# Patient Record
Sex: Female | Born: 1980 | Race: Black or African American | Hispanic: No | Marital: Single | Smoking: Never smoker
Health system: Southern US, Community
[De-identification: ages and names within clinical notes are randomized; demographics above are authoritative.]

## PROBLEM LIST (undated history)

## (undated) DIAGNOSIS — N200 Calculus of kidney: Secondary | ICD-10-CM

## (undated) DIAGNOSIS — K859 Acute pancreatitis without necrosis or infection, unspecified: Secondary | ICD-10-CM

## (undated) HISTORY — PX: ECTOPIC PREGNANCY SURGERY: SHX613

---

## 2011-06-13 ENCOUNTER — Emergency Department: Payer: Self-pay | Admitting: *Deleted

## 2011-06-13 LAB — URINALYSIS, COMPLETE
Bilirubin,UR: NEGATIVE
Glucose,UR: NEGATIVE mg/dL (ref 0–75)
Leukocyte Esterase: NEGATIVE
Nitrite: NEGATIVE
Ph: 7 (ref 4.5–8.0)
Protein: 30
RBC,UR: 4 /HPF (ref 0–5)
Specific Gravity: 1.023 (ref 1.003–1.030)

## 2011-06-13 LAB — PREGNANCY, URINE: Pregnancy Test, Urine: NEGATIVE m[IU]/mL

## 2011-06-21 ENCOUNTER — Emergency Department (HOSPITAL_COMMUNITY)
Admission: EM | Admit: 2011-06-21 | Discharge: 2011-06-21 | Disposition: A | Discharge status: Home / Self Care | Payer: Self-pay | Attending: Emergency Medicine | Admitting: Emergency Medicine

## 2011-06-21 ENCOUNTER — Encounter (HOSPITAL_COMMUNITY): Payer: Self-pay

## 2011-06-21 DIAGNOSIS — R300 Dysuria: Secondary | ICD-10-CM | POA: Insufficient documentation

## 2011-06-21 DIAGNOSIS — R000 Tachycardia, unspecified: Secondary | ICD-10-CM | POA: Insufficient documentation

## 2011-06-21 DIAGNOSIS — R630 Anorexia: Secondary | ICD-10-CM | POA: Insufficient documentation

## 2011-06-21 DIAGNOSIS — R112 Nausea with vomiting, unspecified: Secondary | ICD-10-CM | POA: Insufficient documentation

## 2011-06-21 DIAGNOSIS — R319 Hematuria, unspecified: Secondary | ICD-10-CM | POA: Insufficient documentation

## 2011-06-21 DIAGNOSIS — R109 Unspecified abdominal pain: Secondary | ICD-10-CM | POA: Insufficient documentation

## 2011-06-21 DIAGNOSIS — R1031 Right lower quadrant pain: Secondary | ICD-10-CM | POA: Insufficient documentation

## 2011-06-21 LAB — BASIC METABOLIC PANEL
CO2: 16 mEq/L — ABNORMAL LOW (ref 19–32)
Calcium: 9.8 mg/dL (ref 8.4–10.5)
Chloride: 102 mEq/L (ref 96–112)
Glucose, Bld: 122 mg/dL — ABNORMAL HIGH (ref 70–99)
Potassium: 4.1 mEq/L (ref 3.5–5.1)
Sodium: 134 mEq/L — ABNORMAL LOW (ref 135–145)

## 2011-06-21 LAB — DIFFERENTIAL
Basophils Relative: 0 % (ref 0–1)
Eosinophils Relative: 1 % (ref 0–5)
Monocytes Absolute: 0.8 10*3/uL (ref 0.1–1.0)
Monocytes Relative: 8 % (ref 3–12)
Neutrophils Relative %: 72 % (ref 43–77)

## 2011-06-21 LAB — CBC
Hemoglobin: 11.5 g/dL — ABNORMAL LOW (ref 12.0–15.0)
MCH: 21.7 pg — ABNORMAL LOW (ref 26.0–34.0)
RBC: 5.31 MIL/uL — ABNORMAL HIGH (ref 3.87–5.11)
WBC: 9.4 10*3/uL (ref 4.0–10.5)

## 2011-06-21 MED ORDER — PROMETHAZINE HCL 25 MG/ML IJ SOLN
25.0000 mg | Freq: Once | INTRAMUSCULAR | Status: AC
  Administered 2011-06-21: 25 mg via INTRAVENOUS
  Filled 2011-06-21: qty 1

## 2011-06-21 MED ORDER — SODIUM CHLORIDE 0.9 % IV BOLUS (SEPSIS)
1000.0000 mL | Freq: Once | INTRAVENOUS | Status: AC
  Administered 2011-06-21: 1000 mL via INTRAVENOUS

## 2011-06-21 MED ORDER — HYDROMORPHONE HCL PF 1 MG/ML IJ SOLN
1.0000 mg | Freq: Once | INTRAMUSCULAR | Status: AC
  Administered 2011-06-21: 1 mg via INTRAVENOUS
  Filled 2011-06-21: qty 1

## 2011-06-21 MED ORDER — MORPHINE SULFATE 4 MG/ML IJ SOLN
4.0000 mg | Freq: Once | INTRAMUSCULAR | Status: AC
  Administered 2011-06-21: 4 mg via INTRAVENOUS
  Filled 2011-06-21: qty 1

## 2011-06-21 MED ORDER — SODIUM CHLORIDE 0.9 % IV SOLN: INTRAVENOUS | Status: DC

## 2011-06-21 NOTE — ED Notes (Signed)
[employer] of right sided abdominal pain radiating to lower back. States that there was blood in the urine last void and blood in emesis. States history of kidney stones.

## 2011-06-21 NOTE — ED Notes (Signed)
Rt sided abd and flank pain x 2 days has blood in [place] she states

## 2011-06-21 NOTE — ED Notes (Signed)
Reminded patient we need a urine sample.  Patient tried to urinate but was unable to.

## 2011-06-21 NOTE — ED Notes (Signed)
Pt. Is aware of need a urine specimen

## 2011-06-21 NOTE — ED Notes (Signed)
WEn into reevaluate pt.s  Pain level and she was not in the room..  Unable to locate pt.  She is not in the waiting area.

## 2011-06-21 NOTE — ED Provider Notes (Signed)
History     CSN: [id number]  Arrival date & time 06/21/11  1102   First MD Initiated Contact with Patient 06/21/11 1148      Chief Complaint  Patient presents with  . Abdominal Pain    (Consider location/radiation/quality/duration/timing/severity/associated sxs/prior treatment) Patient is a 31 y.o. female presenting with abdominal pain. The history is provided by the patient. The history is limited by the condition of the patient.  Abdominal Pain The primary symptoms of the illness include abdominal pain, nausea, vomiting and dysuria. The primary symptoms of the illness do not include fever, diarrhea, vaginal discharge or vaginal bleeding.  The dysuria is associated with hematuria.  Additional symptoms associated with the illness include hematuria. Symptoms associated with the illness do not include chills.   the patient is a 31 year old, female, with no significant past medical history, who complains of right flank pain, and right lower abdominal pain for the past 2 days.  She has also had nausea, vomiting, and hematuria.  She denies dysuria.  She has not having her menstrual cycle.  She takes Depo for pregnancy prophylaxis.  She has not had prior abdominal surgery.  Level V caveat applies for urgent need for intervention.  Because of severe pain  No past medical history on file.  No past surgical history on file.  No family history on file.  History  Substance Use Topics  . Smoking status: Not on file  . Smokeless tobacco: Not on file  . Alcohol Use: Not on file    OB History    No data available      Review of Systems  Constitutional: Positive for appetite change. Negative for fever and chills.       Anorexia because of abdominal pain, and nausea and vomiting  Respiratory: Negative for cough.   Cardiovascular: Negative for chest pain.  Gastrointestinal: Positive for nausea, vomiting and abdominal pain. Negative for diarrhea.  Genitourinary: Positive for dysuria,  hematuria and flank pain. Negative for vaginal bleeding and vaginal discharge.  Skin: Negative for rash.  Neurological: Negative for headaches.  Psychiatric/Behavioral: Negative for confusion.  All other systems reviewed and are negative.    Allergies  Reglan and Zofran  Home Medications  No current outpatient prescriptions on file.  BP 124/99  Pulse 115  Temp 98.7 F (37.1 C)  Resp 20  SpO2 99%  Physical Exam  Vitals reviewed. Constitutional: She is oriented to person, place, and time. She appears well-developed and well-nourished. She appears distressed.       Moaning in pain  HENT:  Head: Normocephalic and atraumatic.  Eyes: Pupils are equal, round, and reactive to light.  Neck: Normal range of motion. Neck supple.  Cardiovascular:  No murmur heard.      Tachycardia  Pulmonary/Chest: Effort normal. No respiratory distress. She has no wheezes. She has no rales.  Abdominal: Soft. There is tenderness. There is guarding. There is no rebound.       Right lower abdominal tenderness, with guarding.  No rebound  Genitourinary:       Right or back tenderness  Musculoskeletal: Normal range of motion. She exhibits no edema.  Neurological: She is alert and oriented to person, place, and time. No cranial nerve deficit.  Skin: Skin is warm and dry.  Psychiatric: She has a normal mood and affect. Thought content normal.    ED Course  Procedures (including critical care time) 31 year old, female, with no significant past medical history presents with lower abdominal pain, dysuria and hematuria  and nausea and vomiting.  For the past 2 days.  She is tachycardic, in moderate distress and has a tender.  Abdomen.  We will establish an IV perform laboratory testing, and a urinalysis, and treat her symptoms.  I will get a CAT scan with and without contrast.  Depending on results of the urine tests.   Labs Reviewed  URINALYSIS, ROUTINE W REFLEX MICROSCOPIC  CBC  DIFFERENTIAL  BASIC  METABOLIC PANEL  PREGNANCY, URINE   No results found.   No diagnosis found.    MDM  Abdominal pain, with nausea and vomiting, and tachycardia        Cheri Guppy, MD 06/27/11 [phone]

## 2011-08-09 ENCOUNTER — Emergency Department (HOSPITAL_COMMUNITY): Payer: Self-pay

## 2011-08-09 ENCOUNTER — Emergency Department (HOSPITAL_COMMUNITY)
Admission: EM | Admit: 2011-08-09 | Discharge: 2011-08-09 | Disposition: A | Discharge status: Home / Self Care | Payer: Self-pay | Attending: Emergency Medicine | Admitting: Emergency Medicine

## 2011-08-09 ENCOUNTER — Encounter (HOSPITAL_COMMUNITY): Payer: Self-pay | Admitting: Emergency Medicine

## 2011-08-09 DIAGNOSIS — R109 Unspecified abdominal pain: Secondary | ICD-10-CM | POA: Insufficient documentation

## 2011-08-09 DIAGNOSIS — R112 Nausea with vomiting, unspecified: Secondary | ICD-10-CM | POA: Insufficient documentation

## 2011-08-09 DIAGNOSIS — R319 Hematuria, unspecified: Secondary | ICD-10-CM | POA: Insufficient documentation

## 2011-08-09 DIAGNOSIS — Z87442 Personal history of urinary calculi: Secondary | ICD-10-CM | POA: Insufficient documentation

## 2011-08-09 DIAGNOSIS — R300 Dysuria: Secondary | ICD-10-CM | POA: Insufficient documentation

## 2011-08-09 HISTORY — DX: Calculus of kidney: N20.0

## 2011-08-09 LAB — URINALYSIS, ROUTINE W REFLEX MICROSCOPIC
Glucose, UA: NEGATIVE mg/dL
Hgb urine dipstick: NEGATIVE
Leukocytes, UA: NEGATIVE
Specific Gravity, Urine: 1.009 (ref 1.005–1.030)
pH: 6.5 (ref 5.0–8.0)

## 2011-08-09 LAB — BASIC METABOLIC PANEL
BUN: 9 mg/dL (ref 6–23)
CO2: 21 mEq/L (ref 19–32)
Chloride: 99 mEq/L (ref 96–112)
Creatinine, Ser: 0.8 mg/dL (ref 0.50–1.10)
GFR calc Af Amer: >90 mL/min (ref >90)
Potassium: 3.6 mEq/L (ref 3.5–5.1)

## 2011-08-09 LAB — DIFFERENTIAL
Basophils Absolute: 0 10*3/uL (ref 0.0–0.1)
Eosinophils Relative: 1 % (ref 0–5)
Lymphocytes Relative: 25 % (ref 12–46)
Lymphs Abs: 3.3 10*3/uL (ref 0.7–4.0)
Monocytes Relative: 7 % (ref 3–12)
Neutro Abs: 9 10*3/uL — ABNORMAL HIGH (ref 1.7–7.7)

## 2011-08-09 LAB — CBC
HCT: 34.5 % — ABNORMAL LOW (ref 36.0–46.0)
MCV: 64.4 fL — ABNORMAL LOW (ref 78.0–100.0)
RBC: 5.36 MIL/uL — ABNORMAL HIGH (ref 3.87–5.11)
RDW: 15.2 % (ref 11.5–15.5)
WBC: 13.3 10*3/uL — ABNORMAL HIGH (ref 4.0–10.5)

## 2011-08-09 LAB — POCT PREGNANCY, URINE: Preg Test, Ur: NEGATIVE

## 2011-08-09 MED ORDER — OXYCODONE-ACETAMINOPHEN 5-325 MG PO TABS
2.0000 | ORAL_TABLET | Freq: Once | ORAL | Status: DC
  Filled 2011-08-09: qty 2

## 2011-08-09 MED ORDER — HYDROMORPHONE HCL PF 1 MG/ML IJ SOLN
1.0000 mg | Freq: Once | INTRAMUSCULAR | Status: AC
  Administered 2011-08-09: 1 mg via INTRAVENOUS
  Filled 2011-08-09: qty 1

## 2011-08-09 MED ORDER — NAPROXEN 500 MG PO TABS: 500.0000 mg | ORAL_TABLET | Freq: Two times a day (BID) | ORAL | Status: DC

## 2011-08-09 MED ORDER — SODIUM CHLORIDE 0.9 % IV BOLUS (SEPSIS)
1000.0000 mL | Freq: Once | INTRAVENOUS | Status: AC
  Administered 2011-08-09: 1000 mL via INTRAVENOUS

## 2011-08-09 MED ORDER — PROMETHAZINE HCL 25 MG PO TABS
25.0000 mg | ORAL_TABLET | Freq: Four times a day (QID) | ORAL | Status: DC | PRN
  Administered 2011-08-09: 25 mg via ORAL
  Filled 2011-08-09: qty 1

## 2011-08-09 MED ORDER — FENTANYL CITRATE 0.05 MG/ML IJ SOLN
100.0000 ug | Freq: Once | INTRAMUSCULAR | Status: AC
  Administered 2011-08-09: 100 ug via INTRAVENOUS
  Filled 2011-08-09: qty 2

## 2011-08-09 MED ORDER — KETOROLAC TROMETHAMINE 30 MG/ML IJ SOLN
30.0000 mg | Freq: Once | INTRAMUSCULAR | Status: AC
  Administered 2011-08-09: 30 mg via INTRAVENOUS
  Filled 2011-08-09: qty 1

## 2011-08-09 MED ORDER — PROMETHAZINE HCL 25 MG/ML IJ SOLN
12.5000 mg | Freq: Once | INTRAMUSCULAR | Status: AC
  Administered 2011-08-09: 12.5 mg via INTRAVENOUS
  Filled 2011-08-09: qty 1

## 2011-08-09 NOTE — Discharge Instructions (Signed)
You were seen and evaluated today for your complaints of right flank pains. Your lab testing, urine tests and CT scan of your abdomen does have not shown any signs for concerning or emergent cause your symptoms. There were no signs for kidney stones. No signs for appendicitis. There is no other signs for emergent conditions. Please followup with your primary care provider for further evaluation and treatment of your symptoms.   Flank Pain Flank pain refers to pain that is located on the side of the body between the upper abdomen and the back. It can be caused by many things. CAUSES  Some of the more common causes of flank pain include:  Muscle strain.   Muscle spasms.   A disease of your spine (vertebral disk disease).   A lung infection (pneumonia).   Fluid around your lungs (pulmonary edema).   A kidney infection.   Kidney stones.   A very painful skin rash on only one side of your body (shingles).   Gallbladder disease.  DIAGNOSIS  Blood tests, urine tests, and X-rays may help your caregiver determine what is wrong. TREATMENT  The treatment of pain depends on the cause. Your caregiver will determine what treatment will work best for you. HOME CARE INSTRUCTIONS   Home care will depend on the cause of your pain.   Some medications may help relieve the pain. Take medication for relief of pain as directed by your caregiver.   Tell your caregiver about any changes in your pain.   Follow up with your caregiver.  SEEK IMMEDIATE MEDICAL CARE IF:   Your pain is not controlled with medication.   The pain increases.   You have abdominal pain.   You have shortness of breath.   You have persistent nausea or vomiting.   You have swelling in your abdomen.   You feel faint or pass out.   You have a temperature by mouth above 102 F (38.9 C), not controlled by medicine.  MAKE SURE YOU:   Understand these instructions.   Will watch your condition.   Will get help right  away if you are not doing well or get worse.  Document Released: 05/12/2005 Document Revised: 03/10/2011 Document Reviewed: 09/05/2009 [facility] Patient Information 2012 [place], [place].   Back Pain, Adult Low back pain is very common. About 1 in 5 people have back pain.The cause of low back pain is rarely dangerous. The pain often gets better over time.About half of people with a sudden onset of back pain feel better in just 2 weeks. About 8 in 10 people feel better by 6 weeks.  CAUSES Some common causes of back pain include:  Strain of the muscles or ligaments supporting the spine.   Wear and tear (degeneration) of the spinal discs.   Arthritis.   Direct injury to the back.  DIAGNOSIS Most of the time, the direct cause of low back pain is not known.However, back pain can be treated effectively even when the exact cause of the pain is unknown.Answering your caregiver's questions about your overall health and symptoms is one of the most accurate ways to make sure the cause of your pain is not dangerous. If your caregiver needs more information, he or she may order lab work or imaging tests (X-rays or MRIs).However, even if imaging tests show changes in your back, this usually does not require surgery. HOME CARE INSTRUCTIONS For many people, back pain returns.Since low back pain is rarely dangerous, it is often a condition that  people can learn to [facility] their own.   Remain active. It is stressful on the back to sit or stand in one place. Do not sit, drive, or stand in one place for more than 30 minutes at a time. Take short walks on level surfaces as soon as pain allows.Try to increase the length of time you walk each day.   Do not stay in bed.Resting more than 1 or 2 days can delay your recovery.   Do not avoid exercise or work.Your body is made to move.It is not dangerous to be active, even though your back may hurt.Your back will likely heal faster if you return to being  active before your pain is gone.   Pay attention to your body when you bend and lift. Many people have less discomfortwhen lifting if they bend their knees, keep the load close to their bodies,and avoid twisting. Often, the most comfortable positions are those that put less stress on your recovering back.   Find a comfortable position to sleep. Use a firm mattress and lie on your side with your knees slightly bent. If you lie on your back, put a pillow under your knees.   Only take over-the-counter or prescription medicines as directed by your caregiver. Over-the-counter medicines to reduce pain and inflammation are often the most helpful.Your caregiver may prescribe muscle relaxant drugs.These medicines help dull your pain so you can more quickly return to your normal activities and healthy exercise.   Put ice on the injured area.   Put ice in a plastic bag.   Place a towel between your skin and the bag.   Leave the ice on for 15 to 20 minutes, 3 to 4 times a day for the first 2 to 3 days. After that, ice and heat may be alternated to reduce pain and spasms.   Ask your caregiver about trying back exercises and gentle massage. This may be of some benefit.   Avoid feeling anxious or stressed.Stress increases muscle tension and can worsen back pain.It is important to recognize when you are anxious or stressed and learn ways to manage it.Exercise is a great option.  SEEK MEDICAL CARE IF:  You have pain that is not relieved with rest or medicine.   You have pain that does not improve in 1 week.   You have new symptoms.   You are generally not feeling well.  SEEK IMMEDIATE MEDICAL CARE IF:   You have pain that radiates from your back into your legs.   You develop new bowel or bladder control problems.   You have unusual weakness or numbness in your arms or legs.   You develop nausea or vomiting.   You develop abdominal pain.   You feel faint.  Document Released: 03/21/2005  Document Revised: 03/10/2011 Document Reviewed: 08/09/2010 [facility] Patient Information 2012 [place], [place].   RESOURCE GUIDE  Dental Problems  Patients with Medicaid: [facility] [phone] [address]                                           [phone] [address] Phone:  [phone]  Phone:  [phone]  If unable to pay or uninsured, contact:  [facility] or [facility]. to become qualified for the adult dental clinic.  Chronic Pain Problems Contact [facility]  [phone] Patients need to be referred by their primary care doctor.  Insufficient Money for Medicine Contact United Way:  call "211" or [facility] [phone])635-1577.  No Primary Care Doctor Call [facility]  640-367-2667 Other agencies that provide inexpensive medical care    [facility]  [phone]    [facility]  [phone]    [facility]  [phone]    [facility]  [phone]    [facility]  [phone]    [facility]  [phone]  Psychological Services [facility]  [phone] [facility] Services  [phone] [facility]   [phone] (emergency services [phone])  Substance Abuse Resources [facility]  [phone] [facility] 2232524055 [facility] [place] [phone] Floydene Flock ([phone] Residential & Outpatient Substance Abuse Program  [phone]  Abuse/Neglect [facility] Child Abuse Hotline 509-155-9117 [facility] Child Abuse Hotline [phone] (After Hours)  Emergency Shelter [facility] 323-141-1375  Maternity Homes [facility] at the [place] [facility] [place] 579-505-8688 [facility] [phone]  MRSA Hotline #:   203-636-7135    [facility] Resources  [facility]     United Way                           [facility] Dept. [address] [place]                       [address]      [address]  Blondell Reveal Phone:  [phone]                                   Phone:  [phone]                 Phone:  [phone]  [facility] Phone:  [phone]  [facility] Child Abuse Hotline 818 497 2715 [phone] (After Hours)

## 2011-08-09 NOTE — ED Notes (Signed)
Pt is c/o right flank pain that started 2 days ago and has progressively gotten worse  Pt has hx of kidney stones in the past and states pain feels the same  Pt states has nausea without vomiting

## 2011-08-09 NOTE — ED Provider Notes (Signed)
History     CSN: [id number]  Arrival date & time 08/09/11  0015   First MD Initiated Contact with Patient 08/09/11 0106      Chief Complaint  Patient presents with  . Flank Pain    HPI  History provided by the patient. Patient is 31 year old female with reported history of prior kidney stones who presents with complaints of increasing right flank pains for the past 2 days. She reports slight waxing waning and pains that have now become constant and sharp. It radiates some to right lower abdomen area. Pain feels similar to previous kidney stone symptoms. Patient also reports increased pain with urination. She has also had associated nausea and vomiting symptoms. Patient has been taking over-the-counter pain medications without significant relief. She denies any other aggravating or alleviating factors. Patient denies any fever, chills, sweats. She denies any vaginal bleeding or vaginal discharge.     Past Medical History  Diagnosis Date  . Kidney stones     History reviewed. No pertinent past surgical history.  History reviewed. No pertinent family history.  History  Substance Use Topics  . Smoking status: Never Smoker   . Smokeless tobacco: Not on file  . Alcohol Use: No    OB History    Grav Para Term Preterm Abortions TAB SAB Ect Mult Living                  Review of Systems  Constitutional: Negative for fever and chills.  Respiratory: Negative for shortness of breath.   Cardiovascular: Negative for chest pain.  Gastrointestinal: Positive for nausea and vomiting. Negative for diarrhea and constipation.  Genitourinary: Positive for dysuria, hematuria and flank pain. Negative for frequency.    Allergies  Metoclopramide hcl and Zofran  Home Medications   Current Outpatient Rx  Name Route Sig Dispense Refill  . IBUPROFEN 200 MG PO TABS Oral Take 200 mg by mouth every 6 (six) hours as needed. Pain    . MEDROXYPROGESTERONE ACETATE 150 MG/ML IM SUSP Intramuscular  Inject 150 mg into the muscle every 3 (three) months.      BP 121/79  Pulse 85  Temp(Src) 98.3 F (36.8 C) (Oral)  Resp 18  Physical Exam  Nursing note and vitals reviewed. Constitutional: She is oriented to person, place, and time. She appears well-developed and well-nourished. No distress.  HENT:  Head: Normocephalic.  Cardiovascular: Normal rate and regular rhythm.   Pulmonary/Chest: Effort normal and breath sounds normal. No respiratory distress. She has no wheezes.  Abdominal: Soft. She exhibits no distension. There is no tenderness. There is no rebound and no guarding.       Right CVA tenderness  Neurological: She is alert and oriented to person, place, and time.  Skin: Skin is warm and dry. No rash noted.  Psychiatric: She has a normal mood and affect. Her behavior is normal.    ED Course  Procedures    Results for orders placed during the hospital encounter of 08/09/11  URINALYSIS, ROUTINE W REFLEX MICROSCOPIC      Component Value Range   Color, Urine YELLOW  YELLOW    APPearance CLOUDY (*) CLEAR    Specific Gravity, Urine 1.009  1.005 - 1.030    pH 6.5  5.0 - 8.0    Glucose, UA NEGATIVE  NEGATIVE (mg/dL)   Hgb urine dipstick NEGATIVE  NEGATIVE    Bilirubin Urine NEGATIVE  NEGATIVE    Ketones, ur NEGATIVE  NEGATIVE (mg/dL)   Protein, ur NEGATIVE  NEGATIVE (mg/dL)   Urobilinogen, UA 0.2  0.0 - 1.0 (mg/dL)   Nitrite NEGATIVE  NEGATIVE    Leukocytes, UA NEGATIVE  NEGATIVE   CBC      Component Value Range   WBC 13.3 (*) 4.0 - 10.5 (K/uL)   RBC 5.36 (*) 3.87 - 5.11 (MIL/uL)   Hemoglobin 11.4 (*) 12.0 - 15.0 (g/dL)   HCT 24.4 (*) 01.0 - 46.0 (%)   MCV 64.4 (*) 78.0 - 100.0 (fL)   MCH 21.3 (*) 26.0 - 34.0 (pg)   MCHC 33.0  30.0 - 36.0 (g/dL)   RDW 27.2  53.6 - 64.4 (%)   Platelets 266  150 - 400 (K/uL)  DIFFERENTIAL      Component Value Range   Neutrophils Relative 67  43 - 77 (%)   Lymphocytes Relative 25  12 - 46 (%)   Monocytes Relative 7  3 - 12 (%)    Eosinophils Relative 1  0 - 5 (%)   Basophils Relative 0  0 - 1 (%)   Neutro Abs 9.0 (*) 1.7 - 7.7 (K/uL)   Lymphs Abs 3.3  0.7 - 4.0 (K/uL)   Monocytes Absolute 0.9  0.1 - 1.0 (K/uL)   Eosinophils Absolute 0.1  0.0 - 0.7 (K/uL)   Basophils Absolute 0.0  0.0 - 0.1 (K/uL)   RBC Morphology TARGET CELLS    BASIC METABOLIC PANEL      Component Value Range   Sodium 137  135 - 145 (mEq/L)   Potassium 3.6  3.5 - 5.1 (mEq/L)   Chloride 99  96 - 112 (mEq/L)   CO2 21  19 - 32 (mEq/L)   Glucose, Bld 113 (*) 70 - 99 (mg/dL)   BUN 9  6 - 23 (mg/dL)   Creatinine, Ser 0.34  0.50 - 1.10 (mg/dL)   Calcium 9.8  8.4 - 74.2 (mg/dL)   GFR calc non Af Amer >90  >90 (mL/min)   GFR calc Af Amer >90  >90 (mL/min)  POCT PREGNANCY, URINE      Component Value Range   Preg Test, Ur NEGATIVE  NEGATIVE      Ct Abdomen Pelvis Wo Contrast  08/09/2011  *RADIOLOGY REPORT*  Clinical Data: Right flank pain and nausea.  Leukocytosis.  CT ABDOMEN AND PELVIS WITHOUT CONTRAST  Technique:  Multidetector CT imaging of the abdomen and pelvis was performed following the standard protocol without intravenous contrast.  Comparison: Abdominal radiograph performed 07/07/2011  Findings: The minimally visualized lung bases are clear.  The visualized portions of the liver and spleen are unremarkable in appearance.  The gallbladder is within normal limits.  The pancreas and adrenal glands are unremarkable.  The kidneys are unremarkable in appearance.  There is no evidence of hydronephrosis.  No renal or ureteral stones are seen.  No perinephric stranding is appreciated.  No free fluid is identified.  The small bowel is unremarkable in appearance.  The stomach is within normal limits.  No acute vascular abnormalities are seen.  The appendix is normal in caliber and contains air, without evidence for appendicitis.  Apparent haziness about the ascending and transverse colon is thought to reflect motion artifact.  The colon is grossly  unremarkable in appearance.  The bladder is decompressed and not well assessed.  Mild apparent soft tissue stranding about the bladder could reflect mild cystitis.  The uterus is grossly unremarkable in appearance, though not well assessed without contrast.  The ovaries are grossly symmetric; no suspicious adnexal masses are  seen.  No inguinal lymphadenopathy is seen.  No acute osseous abnormalities are identified.  IMPRESSION:  1.  Mild apparent soft tissue stranding about the bladder may reflect mild cystitis, though this is not well characterized due to bladder decompression. 2.  Otherwise unremarkable CT of the abdomen and pelvis.  Original Report Authenticated By: Tonia Ghent, M.D.     1. Flank pain       MDM  1:10AM patient seen and evaluated. Patient no acute distress but does appear uncomfortable.  Patient continues to complain of pain despite pain medications given. Patient requesting a dose of Dilaudid and Phenergan given simultaneously. Labs this point are unremarkable. Will give additional dose of.it.  CT scan is unremarkable. No signs for kidney stone or hydronephrosis. No signs for appendicitis or other surgical emergency. Patient continues to complain of pain and requesting additional pain medications. Patient is originally from [place] area but states she is here visiting her boyfriend. Patient has had 2 visits to the emergency room recently for abdominal pain complaints. She has had normal workups each time. Drug database shows the patient has received narcotic pain prescriptions multiple times from different providers and locations of the past several months. Patient denied to me having any other previous medical conditions or being on any medications.  At this time patient does not appear to have any emergent condition and will discharge home.      Angus Seller, [place] 08/10/11 [phone]

## 2011-08-10 NOTE — ED Provider Notes (Signed)
Medical screening examination/treatment/procedure(s) were performed by non-physician practitioner and as supervising physician I was immediately available for consultation/collaboration.   Hanley Seamen, MD 08/10/11 [phone]

## 2011-12-11 ENCOUNTER — Emergency Department: Payer: Self-pay | Admitting: Emergency Medicine

## 2011-12-11 LAB — URINALYSIS, COMPLETE
Glucose,UR: NEGATIVE mg/dL (ref 0–75)
Nitrite: NEGATIVE
Ph: 7 (ref 4.5–8.0)
Protein: 30
Specific Gravity: 1.021 (ref 1.003–1.030)

## 2011-12-11 LAB — CBC WITH DIFFERENTIAL/PLATELET
Basophil #: 0.1 10*3/uL (ref 0.0–0.1)
Basophil %: 0.7 %
Eosinophil #: 0 10*3/uL (ref 0.0–0.7)
Eosinophil %: 0.4 %
HCT: 35.5 % (ref 35.0–47.0)
HGB: 11.3 g/dL — ABNORMAL LOW (ref 12.0–16.0)
Lymphocyte #: 1.8 10*3/uL (ref 1.0–3.6)
Lymphocyte %: 17.1 %
MCHC: 31.8 g/dL — ABNORMAL LOW (ref 32.0–36.0)
MCV: 68 fL — ABNORMAL LOW (ref 80–100)
Monocyte %: 5.4 %
Neutrophil #: 8 10*3/uL — ABNORMAL HIGH (ref 1.4–6.5)
Neutrophil %: 76.4 %
RDW: 15.7 % — ABNORMAL HIGH (ref 11.5–14.5)
WBC: 10.5 10*3/uL (ref 3.6–11.0)

## 2011-12-11 LAB — COMPREHENSIVE METABOLIC PANEL
Anion Gap: 8 (ref 7–16)
BUN: 8 mg/dL (ref 7–18)
Bilirubin,Total: 0.3 mg/dL (ref 0.2–1.0)
Chloride: 108 mmol/L — ABNORMAL HIGH (ref 98–107)
Co2: 23 mmol/L (ref 21–32)
Creatinine: 0.75 mg/dL (ref 0.60–1.30)
EGFR (Non-African Amer.): >60
Glucose: 115 mg/dL — ABNORMAL HIGH (ref 65–99)
Potassium: 4.1 mmol/L (ref 3.5–5.1)
SGOT(AST): 29 U/L (ref 15–37)
SGPT (ALT): 14 U/L (ref 12–78)
Sodium: 139 mmol/L (ref 136–145)

## 2011-12-13 LAB — URINE CULTURE

## 2012-02-03 ENCOUNTER — Encounter (HOSPITAL_COMMUNITY): Payer: Self-pay | Admitting: *Deleted

## 2012-02-03 ENCOUNTER — Emergency Department (HOSPITAL_COMMUNITY)
Admission: EM | Admit: 2012-02-03 | Discharge: 2012-02-04 | Disposition: A | Discharge status: Home / Self Care | Payer: Self-pay | Attending: Emergency Medicine | Admitting: Emergency Medicine

## 2012-02-03 DIAGNOSIS — R1031 Right lower quadrant pain: Secondary | ICD-10-CM | POA: Insufficient documentation

## 2012-02-03 DIAGNOSIS — Z79899 Other long term (current) drug therapy: Secondary | ICD-10-CM | POA: Insufficient documentation

## 2012-02-03 DIAGNOSIS — Z87442 Personal history of urinary calculi: Secondary | ICD-10-CM | POA: Insufficient documentation

## 2012-02-03 DIAGNOSIS — R109 Unspecified abdominal pain: Secondary | ICD-10-CM

## 2012-02-03 DIAGNOSIS — R112 Nausea with vomiting, unspecified: Secondary | ICD-10-CM | POA: Insufficient documentation

## 2012-02-03 NOTE — ED Notes (Signed)
The pt is c/o rt sided abd  And flank pain for 3-4 days. lmp none

## 2012-02-04 ENCOUNTER — Emergency Department (HOSPITAL_COMMUNITY): Payer: Self-pay

## 2012-02-04 LAB — COMPREHENSIVE METABOLIC PANEL
AST: 17 U/L (ref 0–37)
Albumin: 4.3 g/dL (ref 3.5–5.2)
Alkaline Phosphatase: 66 U/L (ref 39–117)
BUN: 12 mg/dL (ref 6–23)
Chloride: 106 mEq/L (ref 96–112)
Creatinine, Ser: 0.88 mg/dL (ref 0.50–1.10)
Potassium: 3.6 mEq/L (ref 3.5–5.1)
Total Protein: 7.8 g/dL (ref 6.0–8.3)

## 2012-02-04 LAB — URINE MICROSCOPIC-ADD ON

## 2012-02-04 LAB — CBC WITH DIFFERENTIAL/PLATELET
Basophils Absolute: 0 10*3/uL (ref 0.0–0.1)
Eosinophils Absolute: 0.1 10*3/uL (ref 0.0–0.7)
HCT: 32.9 % — ABNORMAL LOW (ref 36.0–46.0)
Lymphocytes Relative: 30 % (ref 12–46)
MCHC: 32.5 g/dL (ref 30.0–36.0)
Monocytes Relative: 10 % (ref 3–12)
Neutro Abs: 4.3 10*3/uL (ref 1.7–7.7)
Platelets: 213 10*3/uL (ref 150–400)
RDW: 15.7 % — ABNORMAL HIGH (ref 11.5–15.5)
WBC: 7.3 10*3/uL (ref 4.0–10.5)

## 2012-02-04 LAB — URINALYSIS, ROUTINE W REFLEX MICROSCOPIC
Bilirubin Urine: NEGATIVE
Nitrite: NEGATIVE
Specific Gravity, Urine: 1.039 — ABNORMAL HIGH (ref 1.005–1.030)
Urobilinogen, UA: 1 mg/dL (ref 0.0–1.0)
pH: 7 (ref 5.0–8.0)

## 2012-02-04 LAB — PREGNANCY, URINE: Preg Test, Ur: NEGATIVE

## 2012-02-04 LAB — LIPASE, BLOOD: Lipase: 18 U/L (ref 11–59)

## 2012-02-04 MED ORDER — HYDROMORPHONE HCL PF 1 MG/ML IJ SOLN
1.0000 mg | Freq: Once | INTRAMUSCULAR | Status: AC
  Administered 2012-02-04: 1 mg via INTRAVENOUS
  Filled 2012-02-04: qty 1

## 2012-02-04 MED ORDER — DIPHENHYDRAMINE HCL 50 MG/ML IJ SOLN
25.0000 mg | Freq: Once | INTRAMUSCULAR | Status: AC
  Administered 2012-02-04: 25 mg via INTRAVENOUS
  Filled 2012-02-04: qty 1

## 2012-02-04 MED ORDER — HYDROCODONE-ACETAMINOPHEN 5-500 MG PO TABS: 1.0000 | ORAL_TABLET | Freq: Four times a day (QID) | ORAL | Status: DC | PRN

## 2012-02-04 MED ORDER — PROMETHAZINE HCL 25 MG PO TABS: 25.0000 mg | ORAL_TABLET | Freq: Four times a day (QID) | ORAL | Status: DC | PRN

## 2012-02-04 MED ORDER — PROMETHAZINE HCL 25 MG/ML IJ SOLN
25.0000 mg | Freq: Once | INTRAMUSCULAR | Status: AC
  Administered 2012-02-04: 25 mg via INTRAVENOUS
  Filled 2012-02-04: qty 1

## 2012-02-04 MED ORDER — SODIUM CHLORIDE 0.9 % IV BOLUS (SEPSIS)
2000.0000 mL | Freq: Once | INTRAVENOUS | Status: AC
  Administered 2012-02-04: 2000 mL via INTRAVENOUS

## 2012-02-04 MED ORDER — PROMETHAZINE HCL 25 MG/ML IJ SOLN
12.5000 mg | Freq: Once | INTRAMUSCULAR | Status: AC
  Administered 2012-02-04: 12.5 mg via INTRAVENOUS
  Filled 2012-02-04: qty 1

## 2012-02-04 NOTE — ED Provider Notes (Addendum)
History     CSN: [id number]  Arrival date & time 02/03/12  2248   First MD Initiated Contact with Patient 02/04/12 0003      Chief Complaint  Patient presents with  . Flank Pain    (Consider location/radiation/quality/duration/timing/severity/associated sxs/prior treatment) HPI 31 year old female with a history of kidney stones. She is here with a three-day history of right flank pain radiating to her right lower quadrant and right groin. It acutely worsened today and is now severe. She's been vomiting all day and continues to be nauseated. She denies fever or chills. She states the pain is crampy and feels like previous kidney stones. She states she has not been able to find a comfortable position.   Past Medical History  Diagnosis Date  . Kidney stones     History reviewed. No pertinent past surgical history.  No family history on file.  History  Substance Use Topics  . Smoking status: Never Smoker   . Smokeless tobacco: Not on file  . Alcohol Use: No    OB History    Grav Para Term Preterm Abortions TAB SAB Ect Mult Living                  Review of Systems  All other systems reviewed and are negative.    Allergies  Metoclopramide hcl and Zofran  Home Medications   Current Outpatient Rx  Name Route Sig Dispense Refill  . MEDROXYPROGESTERONE ACETATE 150 MG/ML IM SUSP Intramuscular Inject 150 mg into the muscle every 3 (three) months.      BP 115/77  Pulse 130  Temp 99.2 F (37.3 C) (Oral)  Resp 18  SpO2 98%  Physical Exam General: Well-developed, well-nourished female in no acute distress; appearance consistent with age of record; appears uncomfortable HENT: normocephalic, atraumatic Eyes: pupils equal round and reactive to light; extraocular muscles intact Neck: supple Heart: regular rate and rhythm; tachycardia Lungs: clear to auscultation bilaterally Abdomen: soft; nondistended; mild right lower quadrant tenderness; no masses or  hepatosplenomegaly; bowel sounds present GU: Mild right CVA tenderness Extremities: No deformity; full range of motion; pulses normal Neurologic: Awake, alert; motor function intact in all extremities and symmetric; no facial droop Skin: Warm and dry Psychiatric: Anxious    ED Course  Procedures (including critical care time)     MDM   Nursing notes and vitals signs, including pulse oximetry, reviewed.  Summary of this visit's results, reviewed by myself:  Labs:  Results for orders placed during the hospital encounter of 02/03/12  URINALYSIS, ROUTINE W REFLEX MICROSCOPIC      Component Value Range   Color, Urine YELLOW  YELLOW   APPearance TURBID (*) CLEAR   Specific Gravity, Urine 1.039 (*) 1.005 - 1.030   pH 7.0  5.0 - 8.0   Glucose, UA NEGATIVE  NEGATIVE mg/dL   Hgb urine dipstick NEGATIVE  NEGATIVE   Bilirubin Urine NEGATIVE  NEGATIVE   Ketones, ur 15 (*) NEGATIVE mg/dL   Protein, ur 30 (*) NEGATIVE mg/dL   Urobilinogen, UA 1.0  0.0 - 1.0 mg/dL   Nitrite NEGATIVE  NEGATIVE   Leukocytes, UA SMALL (*) NEGATIVE  PREGNANCY, URINE      Component Value Range   Preg Test, Ur NEGATIVE  NEGATIVE  CBC WITH DIFFERENTIAL      Component Value Range   WBC 7.3  4.0 - 10.5 K/uL   RBC 4.99  3.87 - 5.11 MIL/uL   Hemoglobin 10.7 (*) 12.0 - 15.0 g/dL  HCT 32.9 (*) 36.0 - 46.0 %   MCV 65.9 (*) 78.0 - 100.0 fL   MCH 21.4 (*) 26.0 - 34.0 pg   MCHC 32.5  30.0 - 36.0 g/dL   RDW 11.9 (*) 14.7 - 82.9 %   Platelets 213  150 - 400 K/uL   Neutrophils Relative 58  43 - 77 %   Lymphocytes Relative 30  12 - 46 %   Monocytes Relative 10  3 - 12 %   Eosinophils Relative 2  0 - 5 %   Basophils Relative 0  0 - 1 %   Neutro Abs 4.3  1.7 - 7.7 K/uL   Lymphs Abs 2.2  0.7 - 4.0 K/uL   Monocytes Absolute 0.7  0.1 - 1.0 K/uL   Eosinophils Absolute 0.1  0.0 - 0.7 K/uL   Basophils Absolute 0.0  0.0 - 0.1 K/uL   RBC Morphology ELLIPTOCYTES    COMPREHENSIVE METABOLIC PANEL      Component Value  Range   Sodium 144  135 - 145 mEq/L   Potassium 3.6  3.5 - 5.1 mEq/L   Chloride 106  96 - 112 mEq/L   CO2 26  19 - 32 mEq/L   Glucose, Bld 113 (*) 70 - 99 mg/dL   BUN 12  6 - 23 mg/dL   Creatinine, Ser 5.62  0.50 - 1.10 mg/dL   Calcium 9.5  8.4 - 13.0 mg/dL   Total Protein 7.8  6.0 - 8.3 g/dL   Albumin 4.3  3.5 - 5.2 g/dL   AST 17  0 - 37 U/L   ALT 10  0 - 35 U/L   Alkaline Phosphatase 66  39 - 117 U/L   Total Bilirubin 0.1 (*) 0.3 - 1.2 mg/dL   GFR calc non Af Amer 88 (*) >90 mL/min   GFR calc Af Amer >90  >90 mL/min  LIPASE, BLOOD      Component Value Range   Lipase 18  11 - 59 U/L  URINE MICROSCOPIC-ADD ON      Component Value Range   Squamous Epithelial / LPF MANY (*) RARE   WBC, UA 3-6  <3 WBC/hpf   RBC / HPF 0-2  <3 RBC/hpf   Bacteria, UA FEW (*) RARE    Imaging Studies: Ct Abdomen Pelvis Wo Contrast  02/04/2012  *RADIOLOGY REPORT*  Clinical Data: Right flank pain.  Nausea and vomiting.  CT ABDOMEN AND PELVIS WITHOUT CONTRAST  Technique:  Multidetector CT imaging of the abdomen and pelvis was performed following the standard protocol without intravenous contrast.  Comparison: 08/16/2011  Findings: The lung bases are clear.  Mild increased density again demonstrated in the medullary regions which could represent medullary nephrocalcinosis versus normal variation.  Kidneys are otherwise symmetrical and normal in appearance.  No pyelocaliectasis or ureterectasis.  No renal, ureteral, or bladder stones.  No bladder wall thickening.  The unenhanced appearance of the liver, spleen, gallbladder, pancreas, adrenal glands, abdominal aorta, and retroperitoneal lymph nodes is unremarkable.  The stomach, small bowel, and colon are not abnormally distended.  No free air or free fluid in the abdomen.  Pelvis:  The appendix is normal.  No free fluid in the pelvis. Uterus and ovaries are not enlarged.  No evidence of diverticulitis.  No significant pelvic lymphadenopathy.  Normal alignment of the  lumbar vertebrae.  No significant change since previous study.  IMPRESSION: No renal or ureteral stone or obstruction.  Stable appearance since previous study.   Original Report  Authenticated By: Burman Nieves, M.D.     4:48 AM Patient drinking fluids without emesis. Patient advised of unremarkable CT and laboratory studies. Symptoms may represent a viral illness given the nausea and vomiting. We will treat patient symptomatically.        Hanley Seamen, MD 02/04/12 0448  Hanley Seamen, MD 02/04/12 [phone]

## 2012-02-05 LAB — URINE CULTURE

## 2012-05-13 ENCOUNTER — Encounter (HOSPITAL_COMMUNITY): Payer: Self-pay | Admitting: *Deleted

## 2012-05-13 ENCOUNTER — Emergency Department (HOSPITAL_COMMUNITY)
Admission: EM | Admit: 2012-05-13 | Discharge: 2012-05-14 | Disposition: A | Discharge status: Home / Self Care | Payer: Self-pay | Attending: Emergency Medicine | Admitting: Emergency Medicine

## 2012-05-13 DIAGNOSIS — Z87442 Personal history of urinary calculi: Secondary | ICD-10-CM | POA: Insufficient documentation

## 2012-05-13 DIAGNOSIS — Z79899 Other long term (current) drug therapy: Secondary | ICD-10-CM | POA: Insufficient documentation

## 2012-05-13 DIAGNOSIS — R109 Unspecified abdominal pain: Secondary | ICD-10-CM | POA: Insufficient documentation

## 2012-05-13 LAB — URINALYSIS, ROUTINE W REFLEX MICROSCOPIC
Bilirubin Urine: NEGATIVE
Glucose, UA: NEGATIVE mg/dL
Hgb urine dipstick: NEGATIVE
Ketones, ur: NEGATIVE mg/dL
Leukocytes, UA: NEGATIVE
Nitrite: NEGATIVE
Protein, ur: NEGATIVE mg/dL
Specific Gravity, Urine: 1.028 (ref 1.005–1.030)
Urobilinogen, UA: 1 mg/dL (ref 0.0–1.0)
pH: 7 (ref 5.0–8.0)

## 2012-05-13 LAB — PREGNANCY, URINE: Preg Test, Ur: NEGATIVE

## 2012-05-13 MED ORDER — KETOROLAC TROMETHAMINE 30 MG/ML IJ SOLN
15.0000 mg | Freq: Once | INTRAMUSCULAR | Status: AC
  Administered 2012-05-14: 15 mg via INTRAVENOUS
  Filled 2012-05-13: qty 1

## 2012-05-13 MED ORDER — HYDROMORPHONE HCL PF 1 MG/ML IJ SOLN
1.0000 mg | Freq: Once | INTRAMUSCULAR | Status: AC
  Administered 2012-05-14: 1 mg via INTRAVENOUS
  Filled 2012-05-13: qty 1

## 2012-05-13 MED ORDER — PROMETHAZINE HCL 25 MG/ML IJ SOLN
12.5000 mg | Freq: Once | INTRAMUSCULAR | Status: AC
Start: 2012-05-13 — End: 2012-05-14
  Administered 2012-05-14: 12.5 mg via INTRAVENOUS
  Filled 2012-05-13: qty 1

## 2012-05-13 MED ORDER — SODIUM CHLORIDE 0.9 % IV BOLUS (SEPSIS)
1000.0000 mL | Freq: Once | INTRAVENOUS | Status: AC
Start: 2012-05-13 — End: 2012-05-14
  Administered 2012-05-14: 1000 mL via INTRAVENOUS

## 2012-05-13 NOTE — ED Notes (Signed)
Pt c/o right flank pain all day; decreased urination; nausea

## 2012-05-14 ENCOUNTER — Emergency Department (HOSPITAL_COMMUNITY): Payer: Self-pay

## 2012-05-14 LAB — BASIC METABOLIC PANEL
BUN: 10 mg/dL (ref 6–23)
CO2: 23 mEq/L (ref 19–32)
Calcium: 9 mg/dL (ref 8.4–10.5)
Chloride: 99 mEq/L (ref 96–112)
Creatinine, Ser: 0.67 mg/dL (ref 0.50–1.10)
GFR calc Af Amer: >90 mL/min (ref >90)
GFR calc non Af Amer: >90 mL/min (ref >90)
Glucose, Bld: 118 mg/dL — ABNORMAL HIGH (ref 70–99)
Potassium: 3.7 mEq/L (ref 3.5–5.1)
Sodium: 133 mEq/L — ABNORMAL LOW (ref 135–145)

## 2012-05-14 LAB — CBC
HCT: 32.3 % — ABNORMAL LOW (ref 36.0–46.0)
Hemoglobin: 10.8 g/dL — ABNORMAL LOW (ref 12.0–15.0)
MCH: 21.7 pg — ABNORMAL LOW (ref 26.0–34.0)
MCHC: 33.4 g/dL (ref 30.0–36.0)
MCV: 65 fL — ABNORMAL LOW (ref 78.0–100.0)
Platelets: 236 10*3/uL (ref 150–400)
RBC: 4.97 MIL/uL (ref 3.87–5.11)
RDW: 15.4 % (ref 11.5–15.5)
WBC: 8.3 10*3/uL (ref 4.0–10.5)

## 2012-05-14 MED ORDER — NAPROXEN 500 MG PO TABS: 500.0000 mg | ORAL_TABLET | Freq: Two times a day (BID) | ORAL | Status: DC | PRN

## 2012-05-14 MED ORDER — HYDROMORPHONE HCL PF 1 MG/ML IJ SOLN
1.0000 mg | Freq: Once | INTRAMUSCULAR | Status: AC
  Administered 2012-05-14: 1 mg via INTRAVENOUS
  Filled 2012-05-14: qty 1

## 2012-05-14 MED ORDER — DIPHENHYDRAMINE HCL 50 MG/ML IJ SOLN
25.0000 mg | Freq: Once | INTRAMUSCULAR | Status: AC
  Administered 2012-05-14: 25 mg via INTRAVENOUS
  Filled 2012-05-14: qty 1

## 2012-05-14 NOTE — ED Provider Notes (Signed)
History    32 year old female with right flank pain. Atraumatic. Gradual onset this morning and progressively worsening throughout the day. Does not radiate. No appreciable exacerbating/relieving factors. Patient feels like she is urinating less. Complaining of hematuria. Nausea but no vomiting. No unusual vaginal bleeding or discharge. Patient reports past history kidney stones and states that her current symptoms feel similar. No fevers or chills. No intervention prior to arrival.  CSN: [id number]  Arrival date & time 05/13/12  2257   First MD Initiated Contact with Patient 05/13/12 2316      No chief complaint on file.   (Consider location/radiation/quality/duration/timing/severity/associated sxs/prior treatment) HPI  Past Medical History  Diagnosis Date  . Kidney stones     History reviewed. No pertinent past surgical history.  No family history on file.  History  Substance Use Topics  . Smoking status: Never Smoker   . Smokeless tobacco: Not on file  . Alcohol Use: No    OB History   Grav Para Term Preterm Abortions TAB SAB Ect Mult Living                  Review of Systems  All systems reviewed and negative, other than as noted in HPI.   Allergies  Metoclopramide hcl and Zofran  Home Medications   Current Outpatient Rx  Name  Route  Sig  Dispense  Refill  . medroxyPROGESTERone (DEPO-PROVERA) 150 MG/ML injection   Intramuscular   Inject 150 mg into the muscle every 3 (three) months.           BP 134/84  Pulse 121  Temp(Src) 98.7 F (37.1 C) (Oral)  Resp 18  Ht 5' (1.524 m)  Wt 135 lb (61.236 kg)  BMI 26.37 kg/m2  SpO2 99%  Physical Exam  Nursing note and vitals reviewed. Constitutional: She appears well-developed and well-nourished.  Sitting in bed. Mildly uncomfortable appearing  HENT:  Head: Normocephalic and atraumatic.  Eyes: Conjunctivae are normal. Right eye exhibits no discharge. Left eye exhibits no discharge.  Neck: Neck supple.   Cardiovascular: Regular rhythm and normal heart sounds.  Exam reveals no gallop and no friction rub.   No murmur heard. Tachycardic With a regular rhythm  Pulmonary/Chest: Effort normal and breath sounds normal. No respiratory distress.  Abdominal: Soft. She exhibits no distension. There is no tenderness.  Musculoskeletal: She exhibits no edema and no tenderness.  No CVA tenderness  Neurological: She is alert.  Skin: Skin is warm and dry.  Psychiatric: She has a normal mood and affect. Her behavior is normal. Thought content normal.    ED Course  Procedures (including critical care time)  Angiocath insertion Date/Time: 05/14/2012 1:13 AM  Performed by: Raeford Razor  Authorized by: Raeford Razor  Consent: Verbal consent obtained. Risks and benefits: risks, benefits and alternatives were discussed Consent given by: patient  Patient identity confirmed: verbally with patient and provided demographic data  Preparation: Patient was prepped and draped in the usual sterile fashion. Local anesthesia used: no Patient sedated: no Patient tolerance: Patient tolerated the procedure well with no immediate complications. Comments: 20g angiocath placed into R EJ. Withdrew/flushed easily. Pt tolerated procedure well without apparent complication.   Labs Reviewed  URINALYSIS, ROUTINE W REFLEX MICROSCOPIC - Abnormal; Notable for the following:    APPearance CLOUDY (*)    All other components within normal limits  PREGNANCY, URINE  BASIC METABOLIC PANEL  CBC   Ct Abdomen Pelvis Wo Contrast  05/14/2012  *RADIOLOGY REPORT*  Clinical Data: Right  flank pain; decreased urination.  Nausea.  CT ABDOMEN AND PELVIS WITHOUT CONTRAST  Technique:  Multidetector CT imaging of the abdomen and pelvis was performed following the standard protocol without intravenous contrast.  Comparison: None.  Findings: The visualized portions of the liver and spleen are unremarkable in appearance.  The gallbladder is within  normal limits.  The pancreas and adrenal glands are unremarkable.  The kidneys are unremarkable in appearance.  There is no evidence of hydronephrosis.  No renal or ureteral stones are seen.  No perinephric stranding is appreciated.  No free fluid is identified.  The small bowel is unremarkable in appearance.  The stomach is within normal limits.  No acute vascular abnormalities are seen.  The appendix is normal in caliber and contains air, without evidence for appendicitis.  The colon is unremarkable in appearance.  The bladder is mildly distended and grossly unremarkable.  The uterus is grossly unremarkable in appearance, though difficult to fully characterize.  The ovaries are not well assessed; no suspicious adnexal masses are seen.  No inguinal lymphadenopathy is seen.  Scattered phleboliths within the pelvis are stable in appearance; no underlying ureteral stones are seen.  No acute osseous abnormalities are identified.  IMPRESSION: No acute abnormality seen within the abdomen or pelvis.  No renal or ureteral stones identified; no evidence of hydronephrosis.  Note that this is the patient's fourth abdominal CT negative for renal or ureteral stones in the past 9 months; the patient has had similar symptoms with each episode.  Would suggest limiting additional CTs as much as possible, given the risks of such radiation exposure.   Original Report Authenticated By: Tonia Ghent, M.D.      1. Right flank pain       MDM  31yf with R flank pain. Fourth ED visit in past year for the same thing. W/u including 4 CTs all unremarkable. UA unremarkable. Labs unremarkable. Pt with multiple prescriptions for narcotics from various providers in past year. Some concern for med seeking/malingering. Low suspicion for emergent etiology. Pt encouraged to establish PCP. Resource list provided. PRN NSAIDs.         Raeford Razor, MD 05/14/12 [phone]

## 2012-05-14 NOTE — ED Notes (Signed)
Attempt iv x2 unable - charge nurse rachel attempting  At this time

## 2012-08-04 ENCOUNTER — Emergency Department: Payer: Self-pay | Admitting: Emergency Medicine

## 2012-08-04 LAB — COMPREHENSIVE METABOLIC PANEL
Albumin: 4.7 g/dL (ref 3.4–5.0)
Anion Gap: 6 — ABNORMAL LOW (ref 7–16)
BUN: 10 mg/dL (ref 7–18)
Bilirubin,Total: 0.4 mg/dL (ref 0.2–1.0)
Chloride: 105 mmol/L (ref 98–107)
EGFR (Non-African Amer.): >60
Glucose: 113 mg/dL — ABNORMAL HIGH (ref 65–99)
Osmolality: 277 (ref 275–301)
Potassium: 3.1 mmol/L — ABNORMAL LOW (ref 3.5–5.1)
SGPT (ALT): 21 U/L (ref 12–78)
Sodium: 139 mmol/L (ref 136–145)
Total Protein: 8.8 g/dL — ABNORMAL HIGH (ref 6.4–8.2)

## 2012-08-04 LAB — URINALYSIS, COMPLETE
Bilirubin,UR: NEGATIVE
Nitrite: NEGATIVE
Ph: 6 (ref 4.5–8.0)
Protein: 100
Squamous Epithelial: 8
WBC UR: 4 /HPF (ref 0–5)

## 2012-08-04 LAB — CBC WITH DIFFERENTIAL/PLATELET
Eosinophil #: 0.1 10*3/uL (ref 0.0–0.7)
Eosinophil %: 0.5 %
HCT: 34 % — ABNORMAL LOW (ref 35.0–47.0)
HGB: 11 g/dL — ABNORMAL LOW (ref 12.0–16.0)
Lymphocyte %: 22.9 %
MCH: 21.1 pg — ABNORMAL LOW (ref 26.0–34.0)
MCHC: 32.2 g/dL (ref 32.0–36.0)
Monocyte #: 0.9 x10 3/mm (ref 0.2–0.9)
Monocyte %: 7.2 %
Neutrophil #: 8.4 10*3/uL — ABNORMAL HIGH (ref 1.4–6.5)
Platelet: 223 10*3/uL (ref 150–440)
RBC: 5.2 10*6/uL (ref 3.80–5.20)
RDW: 15.5 % — ABNORMAL HIGH (ref 11.5–14.5)
WBC: 12.3 10*3/uL — ABNORMAL HIGH (ref 3.6–11.0)

## 2012-08-04 LAB — LIPASE, BLOOD: Lipase: 71 U/L — ABNORMAL LOW (ref 73–393)

## 2012-09-25 ENCOUNTER — Emergency Department: Payer: Self-pay | Admitting: Emergency Medicine

## 2012-09-25 LAB — CBC
HCT: 34.2 % — ABNORMAL LOW (ref 35.0–47.0)
HGB: 11 g/dL — ABNORMAL LOW (ref 12.0–16.0)
MCV: 66 fL — ABNORMAL LOW (ref 80–100)
Platelet: 232 10*3/uL (ref 150–440)
RDW: 16.2 % — ABNORMAL HIGH (ref 11.5–14.5)
WBC: 11.1 10*3/uL — ABNORMAL HIGH (ref 3.6–11.0)

## 2012-09-25 LAB — COMPREHENSIVE METABOLIC PANEL
Alkaline Phosphatase: 72 U/L (ref 50–136)
BUN: 7 mg/dL (ref 7–18)
Chloride: 106 mmol/L (ref 98–107)
Creatinine: 0.78 mg/dL (ref 0.60–1.30)
EGFR (African American): >60
Osmolality: 279 (ref 275–301)
SGOT(AST): 15 U/L (ref 15–37)
Sodium: 139 mmol/L (ref 136–145)
Total Protein: 7.8 g/dL (ref 6.4–8.2)

## 2012-09-25 LAB — URINALYSIS, COMPLETE
Bacteria: NONE SEEN
Bilirubin,UR: NEGATIVE
Glucose,UR: NEGATIVE mg/dL (ref 0–75)
Ketone: NEGATIVE
Leukocyte Esterase: NEGATIVE
Nitrite: NEGATIVE
Ph: 6 (ref 4.5–8.0)
RBC,UR: 1 /HPF (ref 0–5)
Specific Gravity: 1.016 (ref 1.003–1.030)
WBC UR: 1 /HPF (ref 0–5)

## 2012-09-25 LAB — WET PREP, GENITAL

## 2012-09-26 LAB — URINE CULTURE

## 2012-10-20 ENCOUNTER — Encounter (HOSPITAL_COMMUNITY): Payer: Self-pay

## 2012-10-20 ENCOUNTER — Emergency Department (HOSPITAL_COMMUNITY)
Admission: EM | Admit: 2012-10-20 | Discharge: 2012-10-21 | Disposition: A | Discharge status: Home / Self Care | Payer: Self-pay | Attending: Emergency Medicine | Admitting: Emergency Medicine

## 2012-10-20 DIAGNOSIS — Z3202 Encounter for pregnancy test, result negative: Secondary | ICD-10-CM | POA: Insufficient documentation

## 2012-10-20 DIAGNOSIS — IMO0002 Reserved for concepts with insufficient information to code with codable children: Secondary | ICD-10-CM | POA: Insufficient documentation

## 2012-10-20 DIAGNOSIS — N9489 Other specified conditions associated with female genital organs and menstrual cycle: Secondary | ICD-10-CM | POA: Insufficient documentation

## 2012-10-20 DIAGNOSIS — Z87442 Personal history of urinary calculi: Secondary | ICD-10-CM | POA: Insufficient documentation

## 2012-10-20 DIAGNOSIS — R109 Unspecified abdominal pain: Secondary | ICD-10-CM | POA: Insufficient documentation

## 2012-10-20 DIAGNOSIS — R110 Nausea: Secondary | ICD-10-CM | POA: Insufficient documentation

## 2012-10-20 HISTORY — DX: Calculus of kidney: N20.0

## 2012-10-20 LAB — URINALYSIS, ROUTINE W REFLEX MICROSCOPIC
Bilirubin Urine: NEGATIVE
Nitrite: NEGATIVE
Protein, ur: NEGATIVE mg/dL
Specific Gravity, Urine: 1.031 — ABNORMAL HIGH (ref 1.005–1.030)
Urobilinogen, UA: 1 mg/dL (ref 0.0–1.0)

## 2012-10-20 LAB — URINE MICROSCOPIC-ADD ON

## 2012-10-20 MED ORDER — HYDROMORPHONE HCL PF 1 MG/ML IJ SOLN
1.0000 mg | Freq: Once | INTRAMUSCULAR | Status: DC
  Filled 2012-10-20: qty 1

## 2012-10-20 MED ORDER — SODIUM CHLORIDE 0.9 % IV BOLUS (SEPSIS)
1000.0000 mL | Freq: Once | INTRAVENOUS | Status: AC
  Administered 2012-10-21: 1000 mL via INTRAVENOUS

## 2012-10-20 MED ORDER — HYDROMORPHONE HCL PF 1 MG/ML IJ SOLN
1.0000 mg | Freq: Once | INTRAMUSCULAR | Status: AC
  Administered 2012-10-21: 1 mg via INTRAMUSCULAR

## 2012-10-20 NOTE — ED Notes (Signed)
Pt c/o R flank pain since Thursday. Pt was diagnosed with a kidney stone on Thursday. Pt states pain is getting worse and it feels like it is moving. Pt ambulatory to exam room with steady gait. Pt states she has a ride home.

## 2012-10-20 NOTE — ED Provider Notes (Signed)
History    CSN: [id number] Arrival date & time 10/20/12  2148  First MD Initiated Contact with Patient 10/20/12 2222     Chief Complaint  Patient presents with  . Flank Pain   (Consider location/radiation/quality/duration/timing/severity/associated sxs/prior Treatment) HPI Comments: 32 yo female with a reported history of kidney stones who presents with severe left flank pain.  She states she was diagnosed with a right sided kidney stone earlier this week at an outside obstetrical clinical by ultrasound.  Her pain has not improved, so she came to the ED for further evaluation.  She reports nausea without vomiting.  Pain is cramping, severe, left flank, radiating to left groin.  Sitting upright improves the pain.    Patient is a 32 y.o. female presenting with flank pain.  Flank Pain This is a recurrent problem. The current episode started more than 2 days ago. The problem occurs constantly. The problem has been rapidly worsening. Associated symptoms include abdominal pain. Pertinent negatives include no chest pain and no shortness of breath. Nothing aggravates the symptoms. Nothing relieves the symptoms.   Past Medical History  Diagnosis Date  . Kidney stones    No past surgical history on file. No family history on file. History  Substance Use Topics  . Smoking status: Never Smoker   . Smokeless tobacco: Not on file  . Alcohol Use: No   OB History   Grav Para Term Preterm Abortions TAB SAB Ect Mult Living                 Review of Systems  Constitutional: Negative for fever.  HENT: Negative for congestion.   Respiratory: Negative for cough and shortness of breath.   Cardiovascular: Negative for chest pain.  Gastrointestinal: Positive for abdominal pain. Negative for nausea, vomiting and diarrhea.  Genitourinary: Positive for flank pain. Negative for vaginal bleeding and vaginal discharge.  All other systems reviewed and are negative.    Allergies  Metoclopramide hcl  and Zofran  Home Medications   Current Outpatient Rx  Name  Route  Sig  Dispense  Refill  . medroxyPROGESTERone (DEPO-PROVERA) 150 MG/ML injection   Intramuscular   Inject 150 mg into the muscle every 3 (three) months.         . naproxen (NAPROSYN) 500 MG tablet   Oral   Take 1 tablet (500 mg total) by mouth 2 (two) times daily as needed.   20 tablet   0    BP 124/57  Pulse 57  Temp(Src) 98.9 F (37.2 C) (Oral)  Resp 17  SpO2 100% Physical Exam  Nursing note and vitals reviewed. Constitutional: She is oriented to person, place, and time. She appears well-developed and well-nourished. She appears distressed (appears in pain).  HENT:  Head: Normocephalic and atraumatic.  Mouth/Throat: Oropharynx is clear and moist.  Eyes: Conjunctivae are normal. Pupils are equal, round, and reactive to light. No scleral icterus.  Neck: Neck supple.  Cardiovascular: Normal rate, regular rhythm, normal heart sounds and intact distal pulses.   No murmur heard. Pulmonary/Chest: Effort normal and breath sounds normal. No stridor. No respiratory distress. She has no wheezes. She has no rales.  Abdominal: Soft. Bowel sounds are normal. She exhibits no distension. There is tenderness in the right upper quadrant and epigastric area. There is CVA tenderness (right.).  Genitourinary: There is no rash or tenderness on the right labia. There is no rash or tenderness on the left labia. Uterus is tender (mild). Cervix exhibits no motion tenderness,  no discharge and no friability. Right adnexum displays tenderness. Right adnexum displays no mass and no fullness. Left adnexum displays tenderness (mild). Left adnexum displays no mass and no fullness. No signs of injury around the vagina. No vaginal discharge found.  Musculoskeletal: Normal range of motion.  Neurological: She is alert and oriented to person, place, and time.  Skin: Skin is warm and dry. No rash noted. She is not diaphoretic.  Psychiatric: She  has a normal mood and affect. Her behavior is normal.    ED Course  Angiocath insertion Date/Time: 10/21/2012 2:21 AM Performed by: Blake Divine DAVID Authorized by: Blake Divine DAVID Consent: Verbal consent obtained. Patient tolerance: Patient tolerated the procedure well with no immediate complications. Comments: Nursing unable to obtain IV access.  I placed a  20g angiocath in right EJ vein.  Good return of dark, nonpulsatile blood.  Flushed easily.     (including critical care time) Labs Reviewed  WET PREP, GENITAL - Abnormal; Notable for the following:    WBC, Wet Prep HPF POC FEW (*)    All other components within normal limits  CBC WITH DIFFERENTIAL - Abnormal; Notable for the following:    Hemoglobin 10.3 (*)    HCT 31.3 (*)    MCV 65.5 (*)    MCH 21.5 (*)    All other components within normal limits  COMPREHENSIVE METABOLIC PANEL - Abnormal; Notable for the following:    Potassium 3.4 (*)    Total Bilirubin 0.2 (*)    All other components within normal limits  URINALYSIS, ROUTINE W REFLEX MICROSCOPIC - Abnormal; Notable for the following:    APPearance CLOUDY (*)    Specific Gravity, Urine 1.031 (*)    Hgb urine dipstick SMALL (*)    Leukocytes, UA SMALL (*)    All other components within normal limits  URINE MICROSCOPIC-ADD ON - Abnormal; Notable for the following:    Squamous Epithelial / LPF FEW (*)    Bacteria, UA FEW (*)    All other components within normal limits  URINE CULTURE  GC/CHLAMYDIA PROBE AMP  LIPASE, BLOOD  LACTIC ACID, PLASMA  PREGNANCY, URINE  PREGNANCY, URINE   No results found. 1. Flank pain     MDM  32 yo female presenting with left flank pain.  She reports that she was seen at an OSH ED in [place] 6 days ago at which time an abdominal ultrasound was performed and she was discharged with a diagnosis of dehydration.  She reports that she then followed up at a gynecologists office as directed, at which time a repeat abdominal ultrasound  and a transvaginal ultrasound were performed.  She states a right sided kidney stone was seen on her transvaginal ultrasound.  Since that time, her pain has moved from the right to the left and she has developed pelvic pressure.  She presented to [facility] for further evaluation.  On review of her chart, she has multiple prior ED visits for similar type of pain and multiple negative CT stone studies.  On initial exam, she appeared in pain and was holding her left flank, unable to get comfortable.  However, her RUQ and R flank were tender to palpation and her left side was not.  She had mild diffuse pelvic tenderness on bimanual with slightly worse pain on right.    On prior visits, there has been concern for drug seeking behavior.  Her history is somewhat inconsistent, however she does have the appearance of being in significant  distress.  Given her history of a recent abnormal ultrasound (without being able to review these studies personally) will repeat ultrasound imaging.  IV dilaudid, ativan, and toradol given for pain.  Care transferred to Dr. Norlene Campbell with ultrasound pending.    Candyce Churn, MD 10/21/12 [phone]

## 2012-10-20 NOTE — ED Notes (Signed)
IV & blood draw attempted by multiple RNs. IV Team being paged.

## 2012-10-20 NOTE — ED Notes (Signed)
Unable to obtain IV.  Assistance from another RN requested.

## 2012-10-21 ENCOUNTER — Emergency Department (HOSPITAL_COMMUNITY): Payer: Self-pay

## 2012-10-21 ENCOUNTER — Ambulatory Visit (HOSPITAL_COMMUNITY): Admission: RE | Admit: 2012-10-21 | Payer: Self-pay | Source: Ambulatory Visit

## 2012-10-21 LAB — COMPREHENSIVE METABOLIC PANEL
ALT: 6 U/L (ref 0–35)
AST: 14 U/L (ref 0–37)
CO2: 26 mEq/L (ref 19–32)
Calcium: 9.7 mg/dL (ref 8.4–10.5)
Chloride: 99 mEq/L (ref 96–112)
Creatinine, Ser: 0.73 mg/dL (ref 0.50–1.10)
GFR calc Af Amer: >90 mL/min (ref >90)
GFR calc non Af Amer: >90 mL/min (ref >90)
Glucose, Bld: 96 mg/dL (ref 70–99)
Total Bilirubin: 0.2 mg/dL — ABNORMAL LOW (ref 0.3–1.2)

## 2012-10-21 LAB — PREGNANCY, URINE: Preg Test, Ur: NEGATIVE

## 2012-10-21 LAB — CBC WITH DIFFERENTIAL/PLATELET
Basophils Relative: 0 % (ref 0–1)
Eosinophils Relative: 0 % (ref 0–5)
HCT: 31.3 % — ABNORMAL LOW (ref 36.0–46.0)
Hemoglobin: 10.3 g/dL — ABNORMAL LOW (ref 12.0–15.0)
Lymphs Abs: 1.8 10*3/uL (ref 0.7–4.0)
MCH: 21.5 pg — ABNORMAL LOW (ref 26.0–34.0)
MCV: 65.5 fL — ABNORMAL LOW (ref 78.0–100.0)
Monocytes Absolute: 0.6 10*3/uL (ref 0.1–1.0)
Monocytes Relative: 6 % (ref 3–12)
RBC: 4.78 MIL/uL (ref 3.87–5.11)
WBC: 9.2 10*3/uL (ref 4.0–10.5)

## 2012-10-21 LAB — WET PREP, GENITAL: Clue Cells Wet Prep HPF POC: NONE SEEN

## 2012-10-21 MED ORDER — HYDROMORPHONE HCL PF 1 MG/ML IJ SOLN
1.0000 mg | Freq: Once | INTRAMUSCULAR | Status: AC
  Administered 2012-10-21: 1 mg via INTRAVENOUS

## 2012-10-21 MED ORDER — HYDROMORPHONE HCL PF 1 MG/ML IJ SOLN
1.0000 mg | Freq: Once | INTRAMUSCULAR | Status: DC
  Filled 2012-10-21: qty 1

## 2012-10-21 MED ORDER — LORAZEPAM 2 MG/ML IJ SOLN
1.0000 mg | Freq: Once | INTRAMUSCULAR | Status: AC
  Administered 2012-10-21: 1 mg via INTRAVENOUS
  Filled 2012-10-21: qty 1

## 2012-10-21 MED ORDER — HYDROMORPHONE HCL PF 1 MG/ML IJ SOLN
1.0000 mg | Freq: Once | INTRAMUSCULAR | Status: AC
  Administered 2012-10-21: 1 mg via INTRAVENOUS
  Filled 2012-10-21: qty 1

## 2012-10-21 MED ORDER — KETOROLAC TROMETHAMINE 30 MG/ML IJ SOLN
30.0000 mg | Freq: Once | INTRAMUSCULAR | Status: AC
  Administered 2012-10-21: 30 mg via INTRAVENOUS
  Filled 2012-10-21: qty 1

## 2012-10-21 MED ORDER — PROMETHAZINE HCL 25 MG/ML IJ SOLN
25.0000 mg | Freq: Once | INTRAMUSCULAR | Status: AC
  Administered 2012-10-21: 25 mg via INTRAVENOUS
  Filled 2012-10-21: qty 1

## 2012-10-21 NOTE — ED Notes (Addendum)
Pt up and moving around in room.  Pt very agitated while waiting for ride.  Pulse ox off hand.  Pt drinking sprite per request.  MD notified

## 2012-10-21 NOTE — ED Notes (Signed)
Tech getting vitals for pt d/c.

## 2012-10-21 NOTE — ED Notes (Signed)
Pt c/o pelvic pain. MD notified. Pelvic cart to room.

## 2012-10-21 NOTE — ED Notes (Signed)
Attempted to contact pts mother, Sharrell Ku [phone]. Recorded message stated the phone owner was unavailable. Also attempted to call the pt's phone per registration documentation and the number is not in service.

## 2012-10-21 NOTE — ED Notes (Signed)
Bed:WA15<BR> Expected date:<BR> Expected time:<BR> Means of arrival:<BR> Comments:<BR> EMS

## 2012-10-21 NOTE — ED Notes (Signed)
MD notified of pt heart rate.  Asked that pt be placed on monitor.  Pt refusing monitor but allowed pulse ox.  MD notified.  HR verified via ausculation at 138.  Keep pulse ox and wait for mom.

## 2012-10-21 NOTE — ED Notes (Signed)
Pt ambulated to bathroom 

## 2012-10-21 NOTE — ED Notes (Signed)
Pt requested pain/nausea meds.  MD in critical room.  Explained to pt delay and notified pt when MD notified.  MD has now seen patient.  Awaiting orders.

## 2012-10-21 NOTE — ED Notes (Signed)
Pt refusing all ultrasounds until additional pain meds.  Notified Dr. Norlene Campbell.  Pt states she hasn't had that many meds.  Went over meds with patient.  Pt stating she doesn't remember that many meds and that the other lady knew her meds.  As RN, reminded pt that all meds given by me and what was given.  Pt agreed to ultrasound.

## 2012-10-21 NOTE — ED Notes (Addendum)
Pt went to bathroom.  Noted that pt had left bath and did not return to room.  Notified triage and pt had not left front entrance.  Went to back hall to look for pt and found pt at [employer].  Pt stated she was trying to make it back in to her room and that someone had just tried to help her make it back.  No one in visual at this time.  Pt followed RN back to room.  Concern is for pt sedation and the fact pt told not to drive d/t sedation and safety of patient.  Pt waiting for Mom to pick her up in room.

## 2012-10-21 NOTE — ED Notes (Signed)
Pt asked to see Charge RN and charge notified and spoke to pt.  Charge again went over what meds given to pt.  MD notified.  Pt being d/c.  IV to be d/c and pt to call mom for ride.  Unable to drive d/t narcotics that have been given.

## 2012-10-21 NOTE — ED Notes (Signed)
Pt called to ask for the pain med again.  Will give med at 3 as previously instructed to patient.

## 2012-10-21 NOTE — ED Notes (Signed)
At 5:10 pt found to be down hallway again and stated her Mom was on the way and that I could follow her, that she was heading to the lobby to wait for mom.  RN headed to lobby with d/c papers, pt not there.  Came into dept and told pt went out door and into hospital.  Attempted to find pt and pt not found with security, Charge RN, [facility] and staff assist.  Notified MD that pt has left and has left without visual of driver.  D/C signature and papers was pending Mom arrival and had not been completed.

## 2012-10-21 NOTE — ED Provider Notes (Addendum)
Care assumed from Dr Loretha Stapler at change of shift.  Pt with severe left flank pain, dx recently with left ovarian cyst and right kidney stone in [place], done through u/s at gyn clinic.  Pt with difficult access, Dr Edwyna Ready able to get right IJ.  Pt was awaiting labs, renal u/s and pelvic u/s.  Labs unremarkable.  Results for orders placed during the hospital encounter of 10/20/12  WET PREP, GENITAL      Result Value Range   Yeast Wet Prep HPF POC NONE SEEN  NONE SEEN   Trich, Wet Prep NONE SEEN  NONE SEEN   Clue Cells Wet Prep HPF POC NONE SEEN  NONE SEEN   WBC, Wet Prep HPF POC FEW (*) NONE SEEN  CBC WITH DIFFERENTIAL      Result Value Range   WBC 9.2  4.0 - 10.5 K/uL   RBC 4.78  3.87 - 5.11 MIL/uL   Hemoglobin 10.3 (*) 12.0 - 15.0 g/dL   HCT 62.1 (*) 30.8 - 65.7 %   MCV 65.5 (*) 78.0 - 100.0 fL   MCH 21.5 (*) 26.0 - 34.0 pg   MCHC 32.9  30.0 - 36.0 g/dL   RDW 84.6  96.2 - 95.2 %   Platelets 225  150 - 400 K/uL   Neutrophils Relative % 74  43 - 77 %   Lymphocytes Relative 20  12 - 46 %   Monocytes Relative 6  3 - 12 %   Eosinophils Relative 0  0 - 5 %   Basophils Relative 0  0 - 1 %   Neutro Abs 6.8  1.7 - 7.7 K/uL   Lymphs Abs 1.8  0.7 - 4.0 K/uL   Monocytes Absolute 0.6  0.1 - 1.0 K/uL   Eosinophils Absolute 0.0  0.0 - 0.7 K/uL   Basophils Absolute 0.0  0.0 - 0.1 K/uL   RBC Morphology TARGET CELLS    COMPREHENSIVE METABOLIC PANEL      Result Value Range   Sodium 135  135 - 145 mEq/L   Potassium 3.4 (*) 3.5 - 5.1 mEq/L   Chloride 99  96 - 112 mEq/L   CO2 26  19 - 32 mEq/L   Glucose, Bld 96  70 - 99 mg/dL   BUN 8  6 - 23 mg/dL   Creatinine, Ser 8.41  0.50 - 1.10 mg/dL   Calcium 9.7  8.4 - 32.4 mg/dL   Total Protein 7.4  6.0 - 8.3 g/dL   Albumin 4.2  3.5 - 5.2 g/dL   AST 14  0 - 37 U/L   ALT 6  0 - 35 U/L   Alkaline Phosphatase 60  39 - 117 U/L   Total Bilirubin 0.2 (*) 0.3 - 1.2 mg/dL   GFR calc non Af Amer >90  >90 mL/min   GFR calc Af Amer >90  >90 mL/min   URINALYSIS, ROUTINE W REFLEX MICROSCOPIC      Result Value Range   Color, Urine YELLOW  YELLOW   APPearance CLOUDY (*) CLEAR   Specific Gravity, Urine 1.031 (*) 1.005 - 1.030   pH 6.5  5.0 - 8.0   Glucose, UA NEGATIVE  NEGATIVE mg/dL   Hgb urine dipstick SMALL (*) NEGATIVE   Bilirubin Urine NEGATIVE  NEGATIVE   Ketones, ur NEGATIVE  NEGATIVE mg/dL   Protein, ur NEGATIVE  NEGATIVE mg/dL   Urobilinogen, UA 1.0  0.0 - 1.0 mg/dL   Nitrite NEGATIVE  NEGATIVE   Leukocytes, UA SMALL (*)  NEGATIVE  LIPASE, BLOOD      Result Value Range   Lipase 27  11 - 59 U/L  LACTIC ACID, PLASMA      Result Value Range   Lactic Acid, Venous 1.3  0.5 - 2.2 mmol/L  URINE MICROSCOPIC-ADD ON      Result Value Range   Squamous Epithelial / LPF FEW (*) RARE   WBC, UA 3-6  <3 WBC/hpf   RBC / HPF 3-6  <3 RBC/hpf   Bacteria, UA FEW (*) RARE   Urine-Other RARE YEAST    PREGNANCY, URINE      Result Value Range   Preg Test, Ur NEGATIVE  NEGATIVE   No results found.  Pt has required multiple doses of medications for pain.  She has been noted to be very somnolent at times.  She has required multiple discussions with me and with nursing staff regarding her pain control, and seems confused about her dosing thus far.  She reports to me she has had similar pains off and on for years, and that she does not feel we are doing enough for her pain.  Pt has been awaiting ultrasound for some time, and when the tech arrived, pt refused any exam until she received more pain medication.  I explained again to the patient that she had received more dilaudid than I was willing to give her at that stage of time, and she was falling asleep during the discussion, very confused about who I was, which nurse was caring for her.  [place] tech able to complete renal u/s and pelvic US.  Pt then refused the transvaginal US again demanding more medications.  We had the same discussion again.  I offered bentyl as an alternative, but she reports she has  bentyl listed on her allergies (not listed during today's update).  Pt now wishing to go home.  I feel she is safe for d/c home if she is able to find a ride.  I am highly suspicious of drug seeking behavior, especially as requests for pain medications escalated during her u/s, which I expect will be benign.  U/S tech reports patient was very lethargic/falling asleep during the exam, only waking when the tech moved to the next area, then waking to demand more pain medications.    Olivia Mackie, MD 10/21/12 0425  Pt noted to be tachycardic, no intervening vitals taken since arrival.  Pt very upset at time of vitals, agitated.  Pt informed she would need to stay and be observed until her ride arrived.  Pt went to bathroom, found wandering on back hallway.  Brought back to room, but then seen walking out again.  Pt has been unable to be found by nursing staff or security.  Assume patient left.  I am concerned she may be attempting to drive home, but unable to locate her in parking lot either.  Pt had been informed she was not to drive home after her medications.  She reported to myself and staff that she had been dropped off and did not have a car.  Olivia Mackie, MD 10/21/12 [phone]

## 2012-10-21 NOTE — ED Notes (Signed)
Pt asking for additional pain meds.  Notified MD.  May repeat dilaudid at 3am.  Pt notified.

## 2012-10-21 NOTE — ED Notes (Signed)
Pt refusing last of ultrasounds until additional meds given. Notified MD,  Self and md entered room.  Pt again saying she did not get that many meds.  Explained to patient again no additional narcotics to be given.  MD offered alternatives and pt refused all alternatives.

## 2012-10-22 LAB — GC/CHLAMYDIA PROBE AMP
CT Probe RNA: NEGATIVE
GC Probe RNA: NEGATIVE

## 2012-10-23 LAB — URINE CULTURE

## 2013-05-06 ENCOUNTER — Encounter (HOSPITAL_COMMUNITY): Payer: Self-pay | Admitting: Emergency Medicine

## 2013-05-06 ENCOUNTER — Emergency Department (HOSPITAL_COMMUNITY)
Admission: EM | Admit: 2013-05-06 | Discharge: 2013-05-06 | Discharge status: Against Medical Advice | Payer: Self-pay | Attending: Emergency Medicine | Admitting: Emergency Medicine

## 2013-05-06 DIAGNOSIS — R109 Unspecified abdominal pain: Secondary | ICD-10-CM

## 2013-05-06 DIAGNOSIS — R1031 Right lower quadrant pain: Secondary | ICD-10-CM | POA: Insufficient documentation

## 2013-05-06 DIAGNOSIS — Z87442 Personal history of urinary calculi: Secondary | ICD-10-CM | POA: Insufficient documentation

## 2013-05-06 DIAGNOSIS — R319 Hematuria, unspecified: Secondary | ICD-10-CM | POA: Insufficient documentation

## 2013-05-06 DIAGNOSIS — R1032 Left lower quadrant pain: Secondary | ICD-10-CM | POA: Insufficient documentation

## 2013-05-06 DIAGNOSIS — R1011 Right upper quadrant pain: Secondary | ICD-10-CM | POA: Insufficient documentation

## 2013-05-06 DIAGNOSIS — R1012 Left upper quadrant pain: Secondary | ICD-10-CM | POA: Insufficient documentation

## 2013-05-06 DIAGNOSIS — G8929 Other chronic pain: Secondary | ICD-10-CM | POA: Insufficient documentation

## 2013-05-06 LAB — CBC WITH DIFFERENTIAL/PLATELET
BASOS PCT: 0 % (ref 0–1)
Basophils Absolute: 0 10*3/uL (ref 0.0–0.1)
EOS PCT: 1 % (ref 0–5)
Eosinophils Absolute: 0.1 10*3/uL (ref 0.0–0.7)
HEMATOCRIT: 33.8 % — AB (ref 36.0–46.0)
HEMOGLOBIN: 11.3 g/dL — AB (ref 12.0–15.0)
Lymphocytes Relative: 18 % (ref 12–46)
Lymphs Abs: 1.8 10*3/uL (ref 0.7–4.0)
MCH: 21.6 pg — AB (ref 26.0–34.0)
MCHC: 33.4 g/dL (ref 30.0–36.0)
MCV: 64.6 fL — ABNORMAL LOW (ref 78.0–100.0)
MONO ABS: 0.7 10*3/uL (ref 0.1–1.0)
Monocytes Relative: 7 % (ref 3–12)
NEUTROS ABS: 7.4 10*3/uL (ref 1.7–7.7)
NEUTROS PCT: 74 % (ref 43–77)
Platelets: 244 10*3/uL (ref 150–400)
RBC: 5.23 MIL/uL — AB (ref 3.87–5.11)
RDW: 15.5 % (ref 11.5–15.5)
WBC: 10 10*3/uL (ref 4.0–10.5)

## 2013-05-06 LAB — COMPREHENSIVE METABOLIC PANEL
ALBUMIN: 4.4 g/dL (ref 3.5–5.2)
ALT: 12 U/L (ref 0–35)
AST: 18 U/L (ref 0–37)
Alkaline Phosphatase: 70 U/L (ref 39–117)
BUN: 6 mg/dL (ref 6–23)
CALCIUM: 9.8 mg/dL (ref 8.4–10.5)
CO2: 23 meq/L (ref 19–32)
CREATININE: 0.65 mg/dL (ref 0.50–1.10)
Chloride: 101 mEq/L (ref 96–112)
GFR calc Af Amer: >90 mL/min (ref >90)
Glucose, Bld: 100 mg/dL — ABNORMAL HIGH (ref 70–99)
POTASSIUM: 3.8 meq/L (ref 3.7–5.3)
Sodium: 139 mEq/L (ref 137–147)
TOTAL PROTEIN: 8.2 g/dL (ref 6.0–8.3)
Total Bilirubin: 0.3 mg/dL (ref 0.3–1.2)

## 2013-05-06 LAB — LIPASE, BLOOD: LIPASE: 14 U/L (ref 11–59)

## 2013-05-06 MED ORDER — LORAZEPAM 2 MG/ML IJ SOLN
1.0000 mg | Freq: Once | INTRAMUSCULAR | Status: AC
  Administered 2013-05-06: 1 mg via INTRAMUSCULAR
  Filled 2013-05-06: qty 1

## 2013-05-06 MED ORDER — DIPHENHYDRAMINE HCL 50 MG/ML IJ SOLN
50.0000 mg | Freq: Once | INTRAMUSCULAR | Status: AC
  Administered 2013-05-06: 50 mg via INTRAMUSCULAR
  Filled 2013-05-06: qty 1

## 2013-05-06 NOTE — ED Notes (Signed)
Patient left the facility without any notification.  Found gown on bed.

## 2013-05-06 NOTE — ED Provider Notes (Signed)
CSN: [id number]     Arrival date & time 05/06/13  1831 History   First MD Initiated Contact with Patient 05/06/13 1952     Chief Complaint  Patient presents with  . Back Pain  . Hematuria   (Consider location/radiation/quality/duration/timing/severity/associated sxs/prior Treatment) HPI 33 year old female with chronic intermittent abdominal pain and right-sided low back pain, she states about every 6 months or so she has several days of gradual onset constant severe diffuse abdominal pain more so on the right side than the left as well as right low back pain, she states she is a typical flareup over the last few days of gradual onset pain to her abdomen diffusely but more so on the right as well as her typical right lower flank pain worse with position changes and palpation and better she stays still associated with several episodes of nonbloody vomiting without diarrhea without vaginal bleeding without vaginal discharge without dysuria she has had some apparent gross hematuria like she has had multiple times in the past, she has had multiple ultrasounds and CT scans of her abdomen and pelvis without definitive diagnosis for her chronic intermittent abdominal pain syndrome, she also has a gradual onset diffuse pressure-like headache over the last few days with no trauma no fever no confusion no change in speech or vision no change in swallowing or understanding no focal or lateralizing weakness numbness or incoordination, she states she is walking and talking normally, she states she is no improvement in her symptoms the last few days with ibuprofen. Her headache and abdominal pain and back pain were mild gradually become moderately severe over the last few days. She has had 3 CT scans of the last 2 years for the same abdominal and back symptoms and our health system and is visiting from out of town and usually gets her health care in the [place] area. No fever no IV drug abuse no change in bowel or  bladder function no prior back surgery. Past Medical History  Diagnosis Date  . Kidney stones   . Kidney stone    History reviewed. No pertinent past surgical history. No family history on file. History  Substance Use Topics  . Smoking status: Never Smoker   . Smokeless tobacco: Not on file  . Alcohol Use: No   OB History   Grav Para Term Preterm Abortions TAB SAB Ect Mult Living                 Review of Systems 10 Systems reviewed and are negative for acute change except as noted in the HPI. Allergies  Metoclopramide hcl and Zofran  Home Medications   Current Outpatient Rx  Name  Route  Sig  Dispense  Refill  . ibuprofen (ADVIL,MOTRIN) 200 MG tablet   Oral   Take 400 mg by mouth every 6 (six) hours as needed (pain).         . medroxyPROGESTERone (DEPO-PROVERA) 150 MG/ML injection   Intramuscular   Inject 150 mg into the muscle every 3 (three) months.          BP 146/88  Pulse 103  Temp(Src) 98.7 F (37.1 C) (Oral)  Resp 18  SpO2 100% Physical Exam  Nursing note and vitals reviewed. Constitutional:  Awake, alert, nontoxic appearance with baseline speech for patient.  HENT:  Head: Atraumatic.  Mouth/Throat: No oropharyngeal exudate.  Eyes: EOM are normal. Pupils are equal, round, and reactive to light. Right eye exhibits no discharge. Left eye exhibits no discharge.  Neck:  Neck supple.  Cardiovascular: Normal rate and regular rhythm.   No murmur heard. Pulmonary/Chest: Effort normal and breath sounds normal. No stridor. No respiratory distress. She has no wheezes. She has no rales. She exhibits no tenderness.  Abdominal: Soft. Bowel sounds are normal. She exhibits no mass. There is tenderness. There is no rebound and no guarding.  Mildly tender right upper quadrant and right lower quadrant minimally tender left upper quadrant and left lower quadrant no rebound no CVA tenderness patient does have mild right lower paralumbar tenderness with no rash   Genitourinary:  No CVA tenderness no midline back tenderness; chaperone present for bimanual examination mildly tender right adnexal region no cervical motion tenderness no tenderness to the left adnexal region no discharge or blood on examination glove  Musculoskeletal: She exhibits no tenderness.  Baseline ROM, moves extremities with no obvious new focal weakness.  Lymphadenopathy:    She has no cervical adenopathy.  Neurological: She is alert.  Awake, alert, cooperative and aware of situation; motor strength bilaterally; sensation normal to light touch bilaterally; peripheral visual fields full to confrontation; no facial asymmetry; tongue midline; major cranial nerves appear intact; no pronator drift, normal finger to nose bilaterally  Skin: No rash noted.  Psychiatric: She has a normal mood and affect.    ED Course  Procedures (including critical care time) Patient understand and agree with initial ED impression and plan with expectations set for ED visit.  RN reported Pt eloped. Labs Review Labs Reviewed  COMPREHENSIVE METABOLIC PANEL - Abnormal; Notable for the following:    Glucose, Bld 100 (*)    All other components within normal limits  CBC WITH DIFFERENTIAL - Abnormal; Notable for the following:    RBC 5.23 (*)    Hemoglobin 11.3 (*)    HCT 33.8 (*)    MCV 64.6 (*)    MCH 21.6 (*)    All other components within normal limits  LIPASE, BLOOD   Imaging Review No results found.  EKG Interpretation   None       MDM   1. Chronic abdominal pain    Pt eloped without informing staff.    Hurman Horn, MD 05/10/13 2105

## 2013-05-06 NOTE — ED Notes (Signed)
Patient left without notice.

## 2013-05-06 NOTE — ED Notes (Signed)
Pt c/o back pain and blood in urine x 3 days, pt also states she has had a headache along with it. Pt denies any difficulty urinating.

## 2013-10-26 ENCOUNTER — Emergency Department: Payer: Self-pay | Admitting: Emergency Medicine

## 2013-10-26 LAB — URINALYSIS, COMPLETE
Bacteria: NONE SEEN
Bilirubin,UR: NEGATIVE
GLUCOSE, UR: NEGATIVE mg/dL (ref 0–75)
KETONE: NEGATIVE
Leukocyte Esterase: NEGATIVE
Nitrite: NEGATIVE
Ph: 6 (ref 4.5–8.0)
Protein: NEGATIVE
RBC,UR: 23 /HPF (ref 0–5)
Specific Gravity: 1.018 (ref 1.003–1.030)
Squamous Epithelial: 4
WBC UR: 1 /HPF (ref 0–5)

## 2013-10-26 LAB — COMPREHENSIVE METABOLIC PANEL
ALT: 14 U/L
AST: 13 U/L — AB (ref 15–37)
Albumin: 3.8 g/dL (ref 3.4–5.0)
Alkaline Phosphatase: 68 U/L
Anion Gap: 9 (ref 7–16)
BILIRUBIN TOTAL: 0.2 mg/dL (ref 0.2–1.0)
BUN: 7 mg/dL (ref 7–18)
Calcium, Total: 8.9 mg/dL (ref 8.5–10.1)
Chloride: 104 mmol/L (ref 98–107)
Co2: 27 mmol/L (ref 21–32)
Creatinine: 0.8 mg/dL (ref 0.60–1.30)
EGFR (African American): >60
EGFR (Non-African Amer.): >60
GLUCOSE: 95 mg/dL (ref 65–99)
Osmolality: 277 (ref 275–301)
Potassium: 3.6 mmol/L (ref 3.5–5.1)
SODIUM: 140 mmol/L (ref 136–145)
Total Protein: 7.6 g/dL (ref 6.4–8.2)

## 2013-10-26 LAB — CBC
HCT: 33.6 % — ABNORMAL LOW (ref 35.0–47.0)
HGB: 10.6 g/dL — AB (ref 12.0–16.0)
MCH: 21.5 pg — AB (ref 26.0–34.0)
MCHC: 31.4 g/dL — ABNORMAL LOW (ref 32.0–36.0)
MCV: 68 fL — ABNORMAL LOW (ref 80–100)
Platelet: 228 10*3/uL (ref 150–440)
RBC: 4.91 10*6/uL (ref 3.80–5.20)
RDW: 15.8 % — ABNORMAL HIGH (ref 11.5–14.5)
WBC: 13.1 10*3/uL — ABNORMAL HIGH (ref 3.6–11.0)

## 2013-10-26 LAB — LIPASE, BLOOD: LIPASE: 67 U/L — AB (ref 73–393)

## 2013-10-26 LAB — WET PREP, GENITAL

## 2013-10-26 LAB — HCG, QUANTITATIVE, PREGNANCY

## 2013-10-26 LAB — GC/CHLAMYDIA PROBE AMP

## 2013-12-04 ENCOUNTER — Emergency Department: Payer: Self-pay | Admitting: Emergency Medicine

## 2013-12-04 LAB — URINALYSIS, COMPLETE
Bacteria: NONE SEEN
Bilirubin,UR: NEGATIVE
Glucose,UR: NEGATIVE mg/dL (ref 0–75)
KETONE: NEGATIVE
Leukocyte Esterase: NEGATIVE
NITRITE: NEGATIVE
Ph: 5 (ref 4.5–8.0)
RBC,UR: 6 /HPF (ref 0–5)
Specific Gravity: 1.026 (ref 1.003–1.030)
WBC UR: 3 /HPF (ref 0–5)

## 2013-12-04 LAB — PREGNANCY, URINE: Pregnancy Test, Urine: NEGATIVE m[IU]/mL

## 2014-01-01 ENCOUNTER — Emergency Department: Payer: Self-pay | Admitting: Emergency Medicine

## 2014-01-01 LAB — CBC WITH DIFFERENTIAL/PLATELET
Basophil #: 0.1 10*3/uL (ref 0.0–0.1)
Basophil %: 0.5 %
EOS ABS: 0 10*3/uL (ref 0.0–0.7)
EOS PCT: 0.2 %
HCT: 33.9 % — AB (ref 35.0–47.0)
HGB: 10.6 g/dL — AB (ref 12.0–16.0)
Lymphocyte #: 1.4 10*3/uL (ref 1.0–3.6)
Lymphocyte %: 9.4 %
MCH: 21.1 pg — AB (ref 26.0–34.0)
MCHC: 31.3 g/dL — ABNORMAL LOW (ref 32.0–36.0)
MCV: 67 fL — ABNORMAL LOW (ref 80–100)
MONO ABS: 0.6 x10 3/mm (ref 0.2–0.9)
Monocyte %: 4.3 %
NEUTROS ABS: 12.4 10*3/uL — AB (ref 1.4–6.5)
Neutrophil %: 85.6 %
Platelet: 276 10*3/uL (ref 150–440)
RBC: 5.03 10*6/uL (ref 3.80–5.20)
RDW: 14.7 % — ABNORMAL HIGH (ref 11.5–14.5)
WBC: 14.4 10*3/uL — AB (ref 3.6–11.0)

## 2014-01-01 LAB — URINALYSIS, COMPLETE
BACTERIA: NONE SEEN
BILIRUBIN, UR: NEGATIVE
Glucose,UR: NEGATIVE mg/dL (ref 0–75)
Leukocyte Esterase: NEGATIVE
NITRITE: NEGATIVE
Ph: 6 (ref 4.5–8.0)
Protein: NEGATIVE
RBC,UR: 4 /HPF (ref 0–5)
Specific Gravity: 1.012 (ref 1.003–1.030)

## 2014-01-01 LAB — COMPREHENSIVE METABOLIC PANEL
ALT: 16 U/L
AST: 29 U/L (ref 15–37)
Albumin: 4.2 g/dL (ref 3.4–5.0)
Alkaline Phosphatase: 74 U/L
Anion Gap: 9 (ref 7–16)
BILIRUBIN TOTAL: 0.2 mg/dL (ref 0.2–1.0)
BUN: 7 mg/dL (ref 7–18)
CHLORIDE: 104 mmol/L (ref 98–107)
CO2: 22 mmol/L (ref 21–32)
CREATININE: 0.72 mg/dL (ref 0.60–1.30)
Calcium, Total: 9 mg/dL (ref 8.5–10.1)
EGFR (Non-African Amer.): >60
GLUCOSE: 116 mg/dL — AB (ref 65–99)
Osmolality: 269 (ref 275–301)
POTASSIUM: 3.9 mmol/L (ref 3.5–5.1)
Sodium: 135 mmol/L — ABNORMAL LOW (ref 136–145)
TOTAL PROTEIN: 8.1 g/dL (ref 6.4–8.2)

## 2014-01-01 LAB — LIPASE, BLOOD: Lipase: 72 U/L — ABNORMAL LOW (ref 73–393)

## 2014-01-01 LAB — GC/CHLAMYDIA PROBE AMP

## 2014-01-01 LAB — WET PREP, GENITAL

## 2014-01-28 ENCOUNTER — Emergency Department: Payer: Self-pay | Admitting: Internal Medicine

## 2014-01-28 LAB — CBC WITH DIFFERENTIAL/PLATELET
Basophil #: 0.1 10*3/uL (ref 0.0–0.1)
Basophil %: 1.1 %
Eosinophil #: 0.2 10*3/uL (ref 0.0–0.7)
Eosinophil %: 1.9 %
HCT: 33.6 % — ABNORMAL LOW (ref 35.0–47.0)
HGB: 10.2 g/dL — AB (ref 12.0–16.0)
LYMPHS ABS: 2 10*3/uL (ref 1.0–3.6)
Lymphocyte %: 19 %
MCH: 20.8 pg — AB (ref 26.0–34.0)
MCHC: 30.4 g/dL — AB (ref 32.0–36.0)
MCV: 69 fL — ABNORMAL LOW (ref 80–100)
MONO ABS: 0.8 x10 3/mm (ref 0.2–0.9)
Monocyte %: 7.3 %
NEUTROS PCT: 70.7 %
Neutrophil #: 7.5 10*3/uL — ABNORMAL HIGH (ref 1.4–6.5)
Platelet: 269 10*3/uL (ref 150–440)
RBC: 4.91 10*6/uL (ref 3.80–5.20)
RDW: 16.2 % — ABNORMAL HIGH (ref 11.5–14.5)
WBC: 10.6 10*3/uL (ref 3.6–11.0)

## 2014-01-28 LAB — COMPREHENSIVE METABOLIC PANEL
ALK PHOS: 65 U/L
Albumin: 4.1 g/dL (ref 3.4–5.0)
Anion Gap: 8 (ref 7–16)
BUN: 9 mg/dL (ref 7–18)
Bilirubin,Total: 0.3 mg/dL (ref 0.2–1.0)
CO2: 26 mmol/L (ref 21–32)
Calcium, Total: 8.9 mg/dL (ref 8.5–10.1)
Chloride: 107 mmol/L (ref 98–107)
Creatinine: 0.71 mg/dL (ref 0.60–1.30)
EGFR (African American): >60
EGFR (Non-African Amer.): >60
Glucose: 103 mg/dL — ABNORMAL HIGH (ref 65–99)
OSMOLALITY: 280 (ref 275–301)
POTASSIUM: 3.9 mmol/L (ref 3.5–5.1)
SGOT(AST): 27 U/L (ref 15–37)
SGPT (ALT): 14 U/L
Sodium: 141 mmol/L (ref 136–145)
TOTAL PROTEIN: 7.9 g/dL (ref 6.4–8.2)

## 2014-01-28 LAB — URINALYSIS, COMPLETE
Bacteria: NONE SEEN
Bilirubin,UR: NEGATIVE
Glucose,UR: NEGATIVE mg/dL (ref 0–75)
Ketone: NEGATIVE
LEUKOCYTE ESTERASE: NEGATIVE
NITRITE: NEGATIVE
PH: 7 (ref 4.5–8.0)
SPECIFIC GRAVITY: 1.012 (ref 1.003–1.030)
Squamous Epithelial: 19

## 2014-01-28 LAB — HCG, QUANTITATIVE, PREGNANCY: Beta Hcg, Quant.: <1 m[IU]/mL — ABNORMAL LOW

## 2014-02-20 ENCOUNTER — Emergency Department: Payer: Self-pay | Admitting: Emergency Medicine

## 2014-02-20 LAB — URINALYSIS, COMPLETE
BLOOD: NEGATIVE
Bilirubin,UR: NEGATIVE
GLUCOSE, UR: NEGATIVE mg/dL (ref 0–75)
Nitrite: NEGATIVE
Ph: 6 (ref 4.5–8.0)
Protein: NEGATIVE
RBC,UR: 9 /HPF (ref 0–5)
Specific Gravity: 1.025 (ref 1.003–1.030)
WBC UR: 2 /HPF (ref 0–5)

## 2014-03-30 ENCOUNTER — Encounter (HOSPITAL_COMMUNITY): Payer: Self-pay | Admitting: *Deleted

## 2014-03-30 ENCOUNTER — Emergency Department (HOSPITAL_COMMUNITY)
Admission: EM | Admit: 2014-03-30 | Discharge: 2014-03-30 | Disposition: A | Discharge status: Home / Self Care | Payer: Medicaid Other | Attending: Emergency Medicine | Admitting: Emergency Medicine

## 2014-03-30 ENCOUNTER — Emergency Department (HOSPITAL_COMMUNITY): Payer: Medicaid Other

## 2014-03-30 DIAGNOSIS — Z87442 Personal history of urinary calculi: Secondary | ICD-10-CM | POA: Diagnosis not present

## 2014-03-30 DIAGNOSIS — R1011 Right upper quadrant pain: Secondary | ICD-10-CM | POA: Diagnosis not present

## 2014-03-30 DIAGNOSIS — Z3202 Encounter for pregnancy test, result negative: Secondary | ICD-10-CM | POA: Insufficient documentation

## 2014-03-30 DIAGNOSIS — R112 Nausea with vomiting, unspecified: Secondary | ICD-10-CM | POA: Insufficient documentation

## 2014-03-30 DIAGNOSIS — R1031 Right lower quadrant pain: Secondary | ICD-10-CM | POA: Insufficient documentation

## 2014-03-30 DIAGNOSIS — N939 Abnormal uterine and vaginal bleeding, unspecified: Secondary | ICD-10-CM | POA: Diagnosis present

## 2014-03-30 DIAGNOSIS — R109 Unspecified abdominal pain: Secondary | ICD-10-CM

## 2014-03-30 LAB — CBC WITH DIFFERENTIAL/PLATELET
BASOS PCT: 0 % (ref 0–1)
Basophils Absolute: 0 10*3/uL (ref 0.0–0.1)
Eosinophils Absolute: 0.1 10*3/uL (ref 0.0–0.7)
Eosinophils Relative: 1 % (ref 0–5)
HCT: 33.7 % — ABNORMAL LOW (ref 36.0–46.0)
HEMOGLOBIN: 11 g/dL — AB (ref 12.0–15.0)
LYMPHS ABS: 2.2 10*3/uL (ref 0.7–4.0)
Lymphocytes Relative: 24 % (ref 12–46)
MCH: 21.8 pg — AB (ref 26.0–34.0)
MCHC: 32.6 g/dL (ref 30.0–36.0)
MCV: 66.7 fL — AB (ref 78.0–100.0)
MONO ABS: 0.6 10*3/uL (ref 0.1–1.0)
Monocytes Relative: 7 % (ref 3–12)
Neutro Abs: 6.2 10*3/uL (ref 1.7–7.7)
Neutrophils Relative %: 68 % (ref 43–77)
Platelets: 264 10*3/uL (ref 150–400)
RBC: 5.05 MIL/uL (ref 3.87–5.11)
RDW: 15.6 % — ABNORMAL HIGH (ref 11.5–15.5)
WBC: 9.1 10*3/uL (ref 4.0–10.5)

## 2014-03-30 LAB — URINALYSIS, ROUTINE W REFLEX MICROSCOPIC
Bilirubin Urine: NEGATIVE
Glucose, UA: NEGATIVE mg/dL
Hgb urine dipstick: NEGATIVE
Ketones, ur: NEGATIVE mg/dL
Nitrite: NEGATIVE
Protein, ur: NEGATIVE mg/dL
Specific Gravity, Urine: 1.024 (ref 1.005–1.030)
Urobilinogen, UA: 0.2 mg/dL (ref 0.0–1.0)
pH: 7.5 (ref 5.0–8.0)

## 2014-03-30 LAB — WET PREP, GENITAL
Trich, Wet Prep: NONE SEEN
Yeast Wet Prep HPF POC: NONE SEEN

## 2014-03-30 LAB — COMPREHENSIVE METABOLIC PANEL
ALT: 9 U/L (ref 0–35)
ANION GAP: 11 (ref 5–15)
AST: 20 U/L (ref 0–37)
Albumin: 5.2 g/dL (ref 3.5–5.2)
Alkaline Phosphatase: 65 U/L (ref 39–117)
BUN: 10 mg/dL (ref 6–23)
CALCIUM: 9.8 mg/dL (ref 8.4–10.5)
CO2: 23 mmol/L (ref 19–32)
CREATININE: 0.73 mg/dL (ref 0.50–1.10)
Chloride: 102 mEq/L (ref 96–112)
GLUCOSE: 110 mg/dL — AB (ref 70–99)
Potassium: 4.1 mmol/L (ref 3.5–5.1)
SODIUM: 136 mmol/L (ref 135–145)
Total Bilirubin: 0.5 mg/dL (ref 0.3–1.2)
Total Protein: 8.7 g/dL — ABNORMAL HIGH (ref 6.0–8.3)

## 2014-03-30 LAB — URINE MICROSCOPIC-ADD ON

## 2014-03-30 LAB — PREGNANCY, URINE: Preg Test, Ur: NEGATIVE

## 2014-03-30 LAB — LIPASE, BLOOD: Lipase: 29 U/L (ref 11–59)

## 2014-03-30 MED ORDER — PROMETHAZINE HCL 25 MG/ML IJ SOLN
12.5000 mg | Freq: Once | INTRAMUSCULAR | Status: AC
  Administered 2014-03-30: 12.5 mg via INTRAVENOUS
  Filled 2014-03-30: qty 1

## 2014-03-30 MED ORDER — DIPHENHYDRAMINE HCL 25 MG PO CAPS
25.0000 mg | ORAL_CAPSULE | Freq: Once | ORAL | Status: DC
  Filled 2014-03-30: qty 1

## 2014-03-30 MED ORDER — NAPROXEN 500 MG PO TABS: 500.0000 mg | ORAL_TABLET | Freq: Two times a day (BID) | ORAL | Status: DC

## 2014-03-30 MED ORDER — HYDROMORPHONE HCL 1 MG/ML IJ SOLN
1.0000 mg | INTRAMUSCULAR | Status: DC | PRN
  Administered 2014-03-30: 1 mg via INTRAVENOUS
  Filled 2014-03-30: qty 1

## 2014-03-30 MED ORDER — HYDROMORPHONE HCL 1 MG/ML IJ SOLN
1.0000 mg | Freq: Once | INTRAMUSCULAR | Status: AC
  Administered 2014-03-30: 1 mg via INTRAVENOUS
  Filled 2014-03-30: qty 1

## 2014-03-30 MED ORDER — PROMETHAZINE HCL 25 MG PO TABS: 25.0000 mg | ORAL_TABLET | Freq: Four times a day (QID) | ORAL | Status: DC | PRN

## 2014-03-30 MED ORDER — SODIUM CHLORIDE 0.9 % IV BOLUS (SEPSIS)
1000.0000 mL | Freq: Once | INTRAVENOUS | Status: AC
  Administered 2014-03-30: 1000 mL via INTRAVENOUS

## 2014-03-30 NOTE — ED Provider Notes (Signed)
CSN: [id number]     Arrival date & time 03/30/14  1509 History   First MD Initiated Contact with Patient 03/30/14 1723     Chief Complaint  Patient presents with  . Back Pain  . vaginal bleeding      (Consider location/radiation/quality/duration/timing/severity/associated sxs/prior Treatment) HPI Brandy Jensen is a 33 y.o. female with history of kidney stones, presents to emergency department complaining of right flank pain. Patient states she also has had intermittent vaginal bleeding. States her periods are regular, states she is on Depo-Provera shots. Last vaginal bleeding was 3 months ago. States started bleeding again 2 days ago. States pain started prior to that. Pain is in the right flank radiating to right lower abdomen. She denies any dysuria, hematuria, urinary frequency or urgency. She states she does have history of kidney stones and this feels the same. She admits to nausea and vomiting, states she is unable to keep anything down. Denies diarrhea. She has been taking ibuprofen at home with no relief. No fever, chills. No other complaints.  Past Medical History  Diagnosis Date  . Kidney stones   . Kidney stone    History reviewed. No pertinent past surgical history. History reviewed. No pertinent family history. History  Substance Use Topics  . Smoking status: Never Smoker   . Smokeless tobacco: Not on file  . Alcohol Use: No   OB History    No data available     Review of Systems  Constitutional: Negative for fever and chills.  Respiratory: Negative for cough, chest tightness and shortness of breath.   Cardiovascular: Negative for chest pain, palpitations and leg swelling.  Gastrointestinal: Positive for nausea, vomiting and abdominal pain. Negative for diarrhea.  Genitourinary: Positive for flank pain and vaginal bleeding. Negative for dysuria, vaginal discharge, vaginal pain and pelvic pain.  Musculoskeletal: Negative for myalgias, arthralgias, neck pain and neck  stiffness.  Skin: Negative for rash.  Neurological: Negative for dizziness, weakness and headaches.  All other systems reviewed and are negative.     Allergies  Metoclopramide hcl and Zofran  Home Medications   Prior to Admission medications   Medication Sig Start Date End Date Taking? Authorizing Provider  ibuprofen (ADVIL,MOTRIN) 200 MG tablet Take 400 mg by mouth every 6 (six) hours as needed (pain).   Yes Historical Provider, MD  medroxyPROGESTERone (DEPO-PROVERA) 150 MG/ML injection Inject 150 mg into the muscle every 3 (three) months.   Yes Historical Provider, MD   BP 120/92 mmHg  Pulse 84  Temp(Src) 98.4 F (36.9 C) (Oral)  Resp 16  SpO2 100% Physical Exam  Constitutional: She appears well-developed and well-nourished. No distress.  HENT:  Head: Normocephalic.  Eyes: Conjunctivae are normal.  Neck: Neck supple.  Cardiovascular: Normal rate, regular rhythm and normal heart sounds.   Pulmonary/Chest: Effort normal and breath sounds normal. No respiratory distress. She has no wheezes. She has no rales.  Abdominal: Soft. Bowel sounds are normal. She exhibits no distension. There is tenderness. There is no rebound.  RIght CVA tenderness, RUQ, RLQ tenderness  Genitourinary:  Normal external genitalia. Normal vaginal canal. Small thin white discharge. Cervix is normal, closed. No CMT. No uterine or adnexal tenderness. No masses palpated.    Musculoskeletal: She exhibits no edema.  Neurological: She is alert.  Skin: Skin is warm and dry.  Psychiatric: She has a normal mood and affect. Her behavior is normal.  Nursing note and vitals reviewed.   ED Course  Procedures (including critical care time) Labs Review  Labs Reviewed  WET PREP, GENITAL - Abnormal; Notable for the following:    Clue Cells Wet Prep HPF POC FEW (*)    WBC, Wet Prep HPF POC FEW (*)    All other components within normal limits  URINALYSIS, ROUTINE W REFLEX MICROSCOPIC - Abnormal; Notable for the  following:    APPearance HAZY (*)    Leukocytes, UA SMALL (*)    All other components within normal limits  CBC WITH DIFFERENTIAL - Abnormal; Notable for the following:    Hemoglobin 11.0 (*)    HCT 33.7 (*)    MCV 66.7 (*)    MCH 21.8 (*)    RDW 15.6 (*)    All other components within normal limits  COMPREHENSIVE METABOLIC PANEL - Abnormal; Notable for the following:    Glucose, Bld 110 (*)    Total Protein 8.7 (*)    All other components within normal limits  URINE MICROSCOPIC-ADD ON - Abnormal; Notable for the following:    Squamous Epithelial / LPF MANY (*)    Bacteria, UA FEW (*)    All other components within normal limits  GC/CHLAMYDIA PROBE AMP  PREGNANCY, URINE  LIPASE, BLOOD    Imaging Review No results found.   EKG Interpretation None      MDM   Final diagnoses:  Flank pain   Pt with right flank, vaginal bleeding. VS normal. Pt appears in pain. Will order pain meds, Fluids, will do pelvic exam.   No bleeding on pelvic exam. Pt states she has hx of kidney stone, records reveiwed, pt has been to our ER several times for flank pain, multiple CTs all negative for kindey stone or any findigns. Used "care everywhere" pt with [facility], [facility], [facility], [facility], with same complaint. [facility], had negative work up and CT. Pt apparently got angry when she was offered toradol, she was rude to the staff, pulled out her own IV and left. Pt received 2 doses of IV dilaudid here, continues to have pain. Asked for benadryl, oral ordered. Pt refused. Will d/c   Filed Vitals:   03/30/14 1608 03/30/14 1926 03/30/14 2115  BP: 120/92 125/86 120/81  Pulse: 84 101 89  Temp: 98.4 F (36.9 C) 98.2 F (36.8 C)   TempSrc: Oral Oral   Resp: 16 16 18   SpO2: 100% 100% 100%     Lottie Musselatyana A Mishika Flippen, PA-C 03/30/14 2258  Raeford RazorStephen Kohut, MD 03/31/14 1454

## 2014-03-30 NOTE — ED Notes (Signed)
Pt reports lower back pain and vaginal bleeding x2 days, pain 8/10.  

## 2014-03-30 NOTE — Discharge Instructions (Signed)
Naprosyn for pain. Phenergan for nausea. Follow up with your doctor. 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   Flank Pain Flank pain refers to pain that is located on the side of the body between the upper abdomen and the back. The pain may occur over a short period of time (acute) or may be long-term or reoccurring (chronic). It may be mild or severe. Flank pain can be caused by many things. CAUSES  Some of the more common causes of flank pain include:  Muscle strains.   Muscle spasms.   A disease of your spine (vertebral disk disease).   A lung infection (pneumonia).   Fluid around your lungs (pulmonary edema).   A kidney infection.   Kidney stones.   A very painful skin rash caused by the chickenpox virus (shingles).   Gallbladder disease.  HOME CARE INSTRUCTIONS  Home care will depend on the cause of your pain. In general,  Rest as directed by your caregiver.  Drink enough fluids to keep your urine clear or pale yellow.  Only take over-the-counter or prescription medicines as directed by your caregiver. Some medicines may help relieve the pain.  Tell your caregiver about any changes in your pain.  Follow up with your caregiver as directed. SEEK IMMEDIATE MEDICAL CARE IF:   Your pain is not controlled with medicine.   You have new or worsening symptoms.  Your pain increases.   You have abdominal pain.   You have shortness of breath.   You have persistent nausea or vomiting.   You have swelling in your abdomen.   You feel faint or pass out.   You have blood in your urine.  You have a fever or persistent symptoms for more than 2-3 days.  You have a fever and your symptoms suddenly get worse. MAKE SURE YOU:   Understand these instructions.  Will watch your condition.  Will get help right away if you are not doing well or get worse. Document Released: 05/12/2005 Document Revised: 12/14/2011 Document Reviewed: 11/03/2011 [facility]  Patient Information 2015 [employer] [place]. This information is not intended to replace advice given to you by your health care provider. Make sure you discuss any questions you have with your health care provider.

## 2014-03-30 NOTE — ED Notes (Signed)
Went to go administer Benadryl medication to pt. Pt stated that she was not able to take medication by mouth cause she is nauseated and did not feel she would be able to keep it down. Pt asked why were we giving it by mouth. Explained to pt that that is what the PA chose. Pt stated that she would wait.

## 2014-03-31 LAB — GC/CHLAMYDIA PROBE AMP
CT PROBE, AMP APTIMA: NEGATIVE
GC Probe RNA: NEGATIVE

## 2014-06-01 ENCOUNTER — Emergency Department: Payer: Self-pay | Admitting: Internal Medicine

## 2014-06-25 ENCOUNTER — Emergency Department (HOSPITAL_COMMUNITY)
Admission: EM | Admit: 2014-06-25 | Discharge: 2014-06-25 | Payer: Medicaid Other | Attending: Emergency Medicine | Admitting: Emergency Medicine

## 2014-06-25 ENCOUNTER — Encounter (HOSPITAL_COMMUNITY): Payer: Self-pay

## 2014-06-25 DIAGNOSIS — Z791 Long term (current) use of non-steroidal anti-inflammatories (NSAID): Secondary | ICD-10-CM | POA: Diagnosis not present

## 2014-06-25 DIAGNOSIS — N938 Other specified abnormal uterine and vaginal bleeding: Secondary | ICD-10-CM | POA: Diagnosis present

## 2014-06-25 DIAGNOSIS — R109 Unspecified abdominal pain: Secondary | ICD-10-CM

## 2014-06-25 DIAGNOSIS — Z87442 Personal history of urinary calculi: Secondary | ICD-10-CM | POA: Insufficient documentation

## 2014-06-25 DIAGNOSIS — R1031 Right lower quadrant pain: Secondary | ICD-10-CM | POA: Insufficient documentation

## 2014-06-25 DIAGNOSIS — M549 Dorsalgia, unspecified: Secondary | ICD-10-CM | POA: Diagnosis not present

## 2014-06-25 DIAGNOSIS — Z3202 Encounter for pregnancy test, result negative: Secondary | ICD-10-CM | POA: Diagnosis not present

## 2014-06-25 LAB — URINALYSIS, ROUTINE W REFLEX MICROSCOPIC
Bilirubin Urine: NEGATIVE
Glucose, UA: NEGATIVE mg/dL
Ketones, ur: NEGATIVE mg/dL
Leukocytes, UA: NEGATIVE
Nitrite: NEGATIVE
Protein, ur: NEGATIVE mg/dL
Specific Gravity, Urine: 1.021 (ref 1.005–1.030)
Urobilinogen, UA: 0.2 mg/dL (ref 0.0–1.0)
pH: 7.5 (ref 5.0–8.0)

## 2014-06-25 LAB — URINE MICROSCOPIC-ADD ON

## 2014-06-25 LAB — PREGNANCY, URINE: PREG TEST UR: NEGATIVE

## 2014-06-25 MED ORDER — HYDROMORPHONE HCL 1 MG/ML IJ SOLN: 0.5000 mg | Freq: Once | INTRAMUSCULAR | Status: DC

## 2014-06-25 MED ORDER — KETOROLAC TROMETHAMINE 30 MG/ML IJ SOLN
30.0000 mg | Freq: Once | INTRAMUSCULAR | Status: DC
  Filled 2014-06-25: qty 1

## 2014-06-25 MED ORDER — PROMETHAZINE HCL 25 MG/ML IJ SOLN
12.5000 mg | Freq: Once | INTRAMUSCULAR | Status: DC
  Filled 2014-06-25: qty 1

## 2014-06-25 MED ORDER — KETOROLAC TROMETHAMINE 60 MG/2ML IM SOLN
60.0000 mg | Freq: Once | INTRAMUSCULAR | Status: DC
  Filled 2014-06-25: qty 2

## 2014-06-25 NOTE — ED Provider Notes (Signed)
CSN: [id number]     Arrival date & time 06/25/14  1018 History   First MD Initiated Contact with Patient 06/25/14 1045     Chief Complaint  Patient presents with  . Back Pain  . Vaginal Bleeding     (Consider location/radiation/quality/duration/timing/severity/associated sxs/prior Treatment) Patient is a 34 y.o. female presenting with back pain and vaginal bleeding. The history is provided by the patient and medical records.  Back Pain Vaginal Bleeding Associated symptoms: back pain     This is a 34 year old female with past medical history significant for kidney stones, presenting to the ED for right flank pain with radiation to her right lower abdomen which began last night. Patient states symptoms have gotten progressively worse. She also notes nausea and vomiting. States this feels like her prior kidney stone.  Cannot endorse hematuria as he has started having some vaginal bleeding. She is currently on Depo shots and has irregular cycles because of this. She is not sure if it is time for her menstrual. States yesterday she was passing some clots, now more like vaginal spotting.  She denies any fever or chills.  Hx of ectopic pregnancy.  VSS.  Past Medical History  Diagnosis Date  . Kidney stones   . Kidney stone    Past Surgical History  Procedure Laterality Date  . Ectopic pregnancy surgery     History reviewed. No pertinent family history. History  Substance Use Topics  . Smoking status: Never Smoker   . Smokeless tobacco: Never Used  . Alcohol Use: No   OB History    No data available     Review of Systems  Genitourinary: Positive for vaginal bleeding.  Musculoskeletal: Positive for back pain.      Allergies  Metoclopramide hcl and Zofran  Home Medications   Prior to Admission medications   Medication Sig Start Date End Date Taking? Authorizing Provider  medroxyPROGESTERone (DEPO-PROVERA) 150 MG/ML injection Inject 150 mg into the muscle every 3 (three)  months.   Yes Historical Provider, MD  naproxen (NAPROSYN) 500 MG tablet Take 1 tablet (500 mg total) by mouth 2 (two) times daily. Patient not taking: Reported on 06/25/2014 03/30/14   Jaynie Crumble, PA-C  promethazine (PHENERGAN) 25 MG tablet Take 1 tablet (25 mg total) by mouth every 6 (six) hours as needed for nausea or vomiting. Patient not taking: Reported on 06/25/2014 03/30/14   Tatyana Kirichenko, PA-C   BP 141/96 mmHg  Pulse 85  Temp(Src) 97.9 F (36.6 C) (Oral)  Resp 20  SpO2 100%   Physical Exam  Constitutional: She is oriented to person, place, and time. She appears well-developed and well-nourished. No distress.  Writing in bed, moaning  HENT:  Head: Normocephalic and atraumatic.  Mouth/Throat: Oropharynx is clear and moist.  Eyes: Conjunctivae and EOM are normal. Pupils are equal, round, and reactive to light.  Neck: Normal range of motion. Neck supple.  Cardiovascular: Normal rate, regular rhythm and normal heart sounds.   Pulmonary/Chest: Effort normal and breath sounds normal. No respiratory distress. She has no wheezes.  Abdominal: Soft. Bowel sounds are normal. There is no tenderness. There is CVA tenderness (right). There is no guarding.  Musculoskeletal: Normal range of motion. She exhibits no edema.  Neurological: She is alert and oriented to person, place, and time.  Skin: Skin is warm and dry. She is not diaphoretic.  Psychiatric: She has a normal mood and affect.  Nursing note and vitals reviewed.   ED Course  Procedures (including critical  care time) Labs Review Labs Reviewed  URINALYSIS, ROUTINE W REFLEX MICROSCOPIC - Abnormal; Notable for the following:    APPearance CLOUDY (*)    Hgb urine dipstick SMALL (*)    All other components within normal limits  URINE MICROSCOPIC-ADD ON - Abnormal; Notable for the following:    Squamous Epithelial / LPF FEW (*)    All other components within normal limits  PREGNANCY, URINE  CBC WITH  DIFFERENTIAL/PLATELET  BASIC METABOLIC PANEL    Imaging Review No results found.   EKG Interpretation None      MDM   Final diagnoses:  Right flank pain   34 year old female with right flank pain and vaginal bleeding. She is on depo with irregular menstural cycles.  On review of medical records, patient has had multiple ED visits for exact same symptoms. She has [facility] [address] [facility], [facility], [facility], as well [facility].  Of note, patient was seen twice on 3/21 and twice on 3/22, with negative work-up including CT scan and ultrasounds.  On exam, patient writhing in pain and moaning.  Will obtain u/a, blood work.  Will hold on imaging for now  11:25 AM Notified by nursing staff that patient was seen leaving just after my initial evaluation.  She refused lab draw and toradol that was offered.  Patient left without lab work resulting, final evaluation or disposition.  Garlon HatchetLisa M Kayzlee Wirtanen, PA-C 06/25/14 1231  Rolland PorterMark James, MD 07/11/14 [phone]

## 2014-06-25 NOTE — ED Notes (Signed)
Patient c/o right lower back pain that radiates into the right lower abdomen. Patient states she had vaginal bleeding with clots, but today is a small amount of vaginal bleeding.

## 2014-06-25 NOTE — ED Notes (Signed)
Pt left against medical advice. RN notified.

## 2014-06-25 NOTE — ED Notes (Signed)
PA stopped RN prior to IV start and medication admin.  Pt given order for toradol IM instead.  Pt is refusing stating that it does not work.  Will notify PA.

## 2014-06-25 NOTE — ED Notes (Signed)
Patient refused to let me collect her labs she stated that she want to wait until her urine results come back

## 2014-06-25 NOTE — Progress Notes (Signed)
pcp listed per e medicaid response [facility] [address] [place], [place] [id number] [phone]

## 2014-06-25 NOTE — ED Notes (Signed)
Notified PA that pt refused toradol.  Pt also refused lab work.  Pt seen leaving the department.  Pt asked it she was leaving AMA.  Pt stated it doesn't matter and walked out.  PA notified.  Pt listed as eloped.  No studies had resulted.

## 2014-07-18 ENCOUNTER — Emergency Department (HOSPITAL_COMMUNITY)
Admission: EM | Admit: 2014-07-18 | Discharge: 2014-07-18 | Discharge status: Against Medical Advice | Payer: Medicaid Other | Attending: Emergency Medicine | Admitting: Emergency Medicine

## 2014-07-18 ENCOUNTER — Encounter (HOSPITAL_COMMUNITY): Payer: Self-pay | Admitting: *Deleted

## 2014-07-18 DIAGNOSIS — R109 Unspecified abdominal pain: Secondary | ICD-10-CM | POA: Insufficient documentation

## 2014-07-18 DIAGNOSIS — R112 Nausea with vomiting, unspecified: Secondary | ICD-10-CM | POA: Insufficient documentation

## 2014-07-18 NOTE — ED Notes (Signed)
Patient called for second time for room assignment. Unable to locate patient.

## 2014-07-18 NOTE — ED Notes (Signed)
Patient called from room assignment, unable to locate patient

## 2014-07-18 NOTE — ED Notes (Signed)
Pt c/o right sided flank pain, n/v, and some bleeding when she urinates. X 3 days.

## 2014-11-01 IMAGING — US US PELV - US TRANSVAGINAL
1 series · 14 of 25 positions shown · non-contrast
Comparison: None

CLINICAL DATA: Left lower quadrant abdominal pain.

EXAM:
TRANSABDOMINAL AND TRANSVAGINAL ULTRASOUND OF PELVIS
TECHNIQUE: Both transabdominal and transvaginal ultrasound examinations of the
pelvis were performed. Transabdominal technique was performed for
global imaging of the pelvis including uterus, ovaries, adnexal
regions, and pelvic cul-de-sac. It was necessary to proceed with
endovaginal exam following the transabdominal exam to visualize the
uterus and ovaries in greater detail.

[Series 1: us pelv - us transvaginal · 0.21mm/px · 14 of 65 slices shown]
[im 1/65]
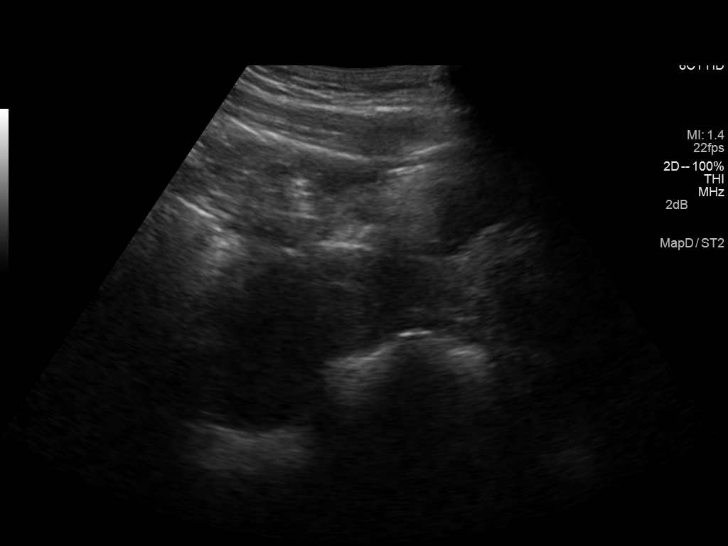
[im 6/65]
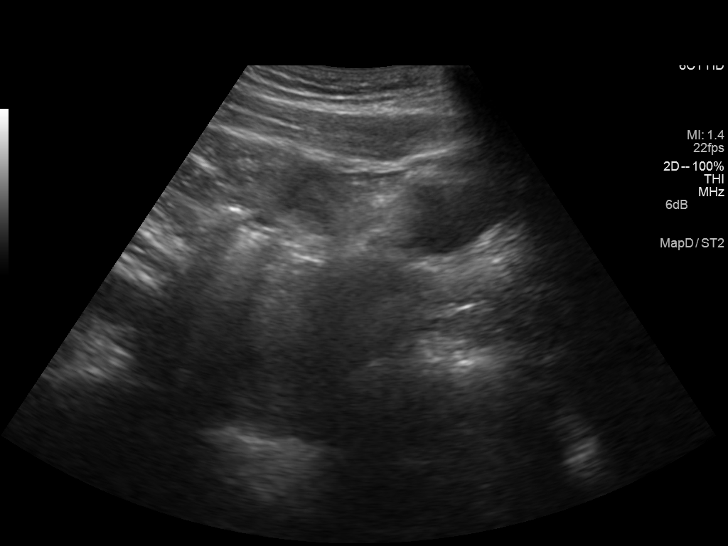
[im 11/65]
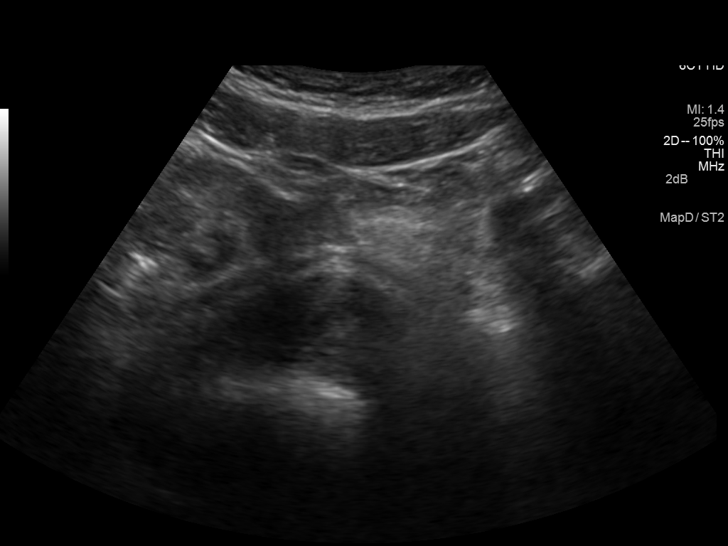
[im 17/65]
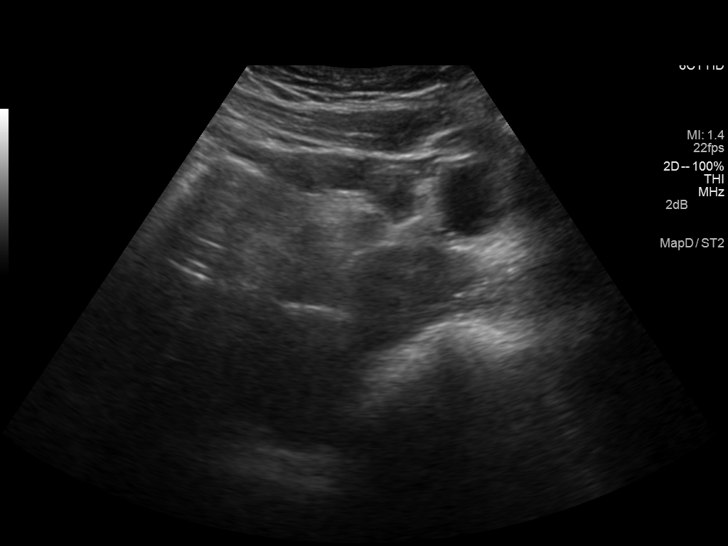
[im 22/65]
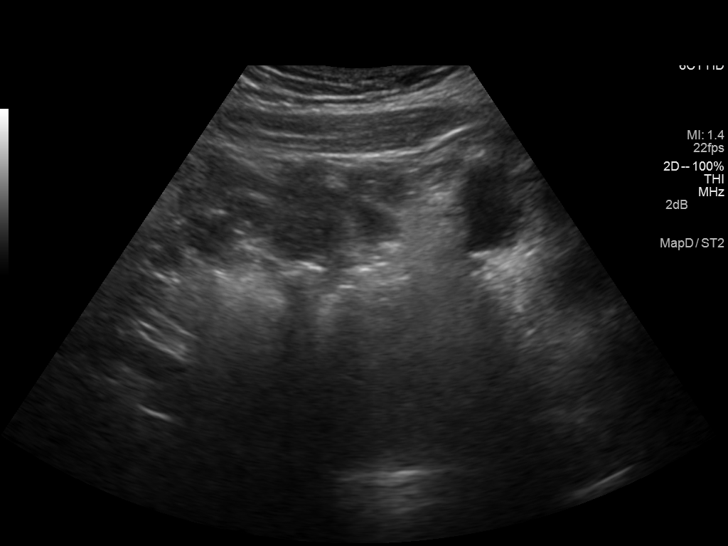
[im 25/65]
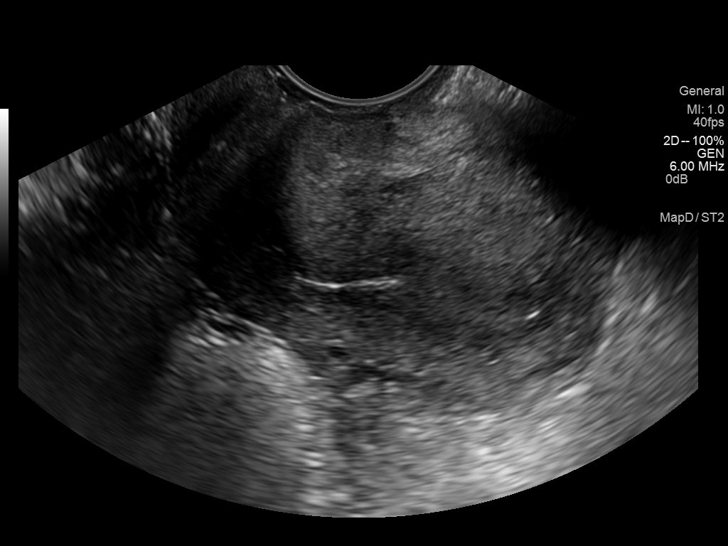
[im 30/65]
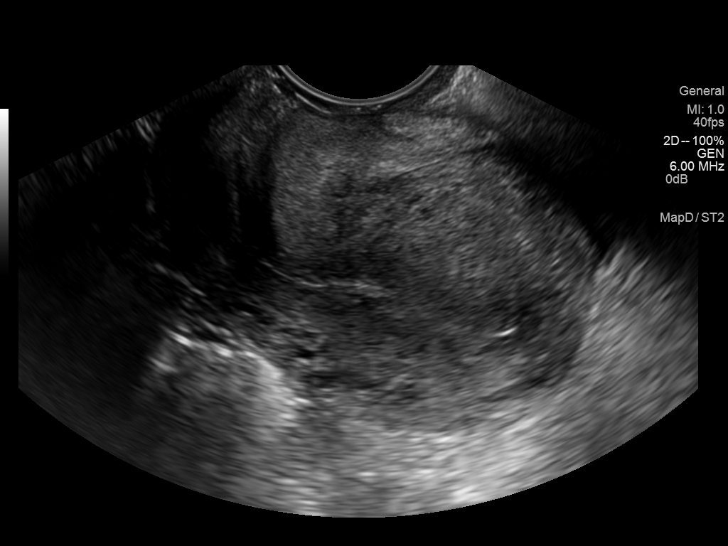
[im 35/65]
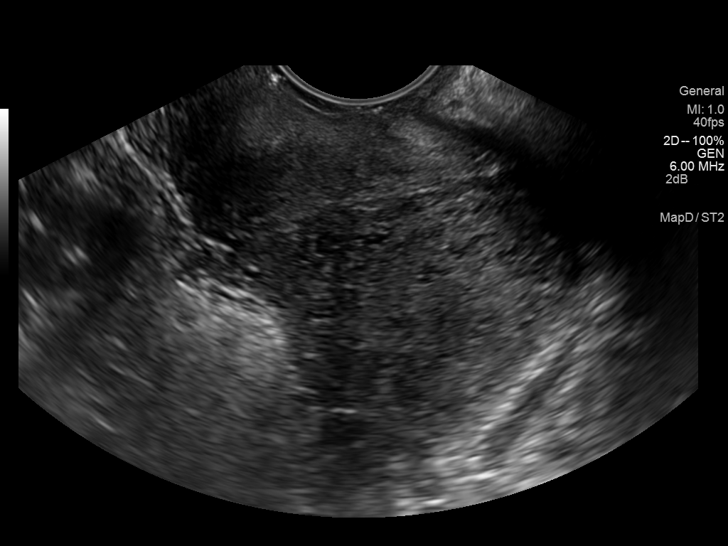
[im 41/65]
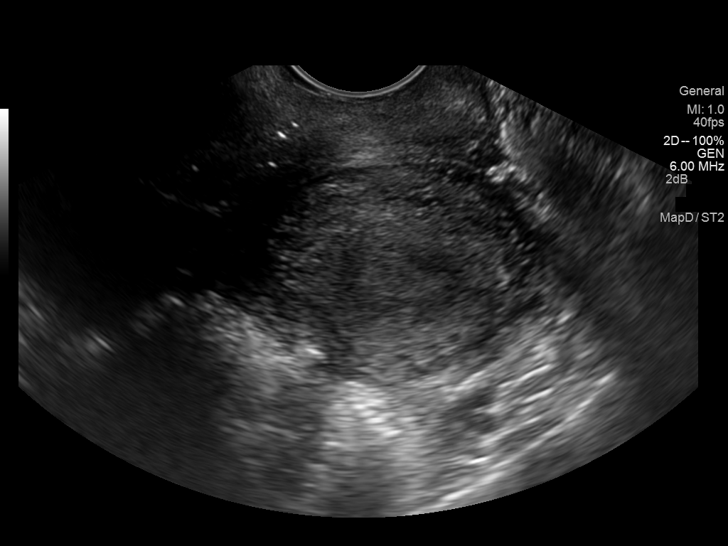
[im 43/65]
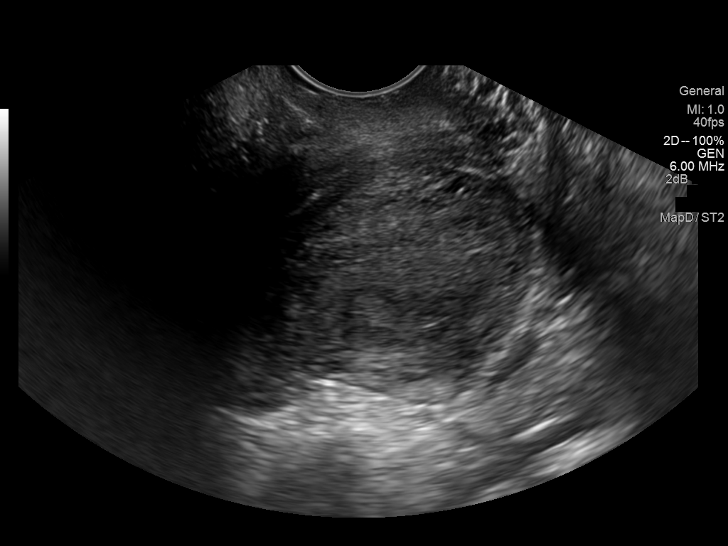
[im 49/65]
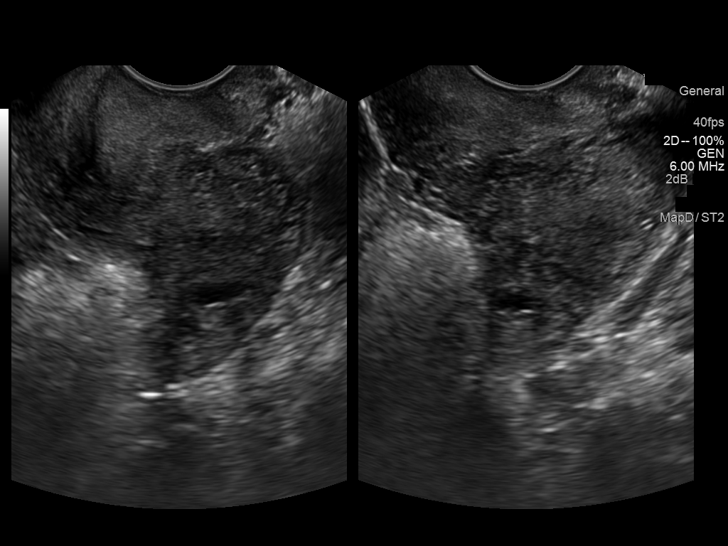
[im 54/65]
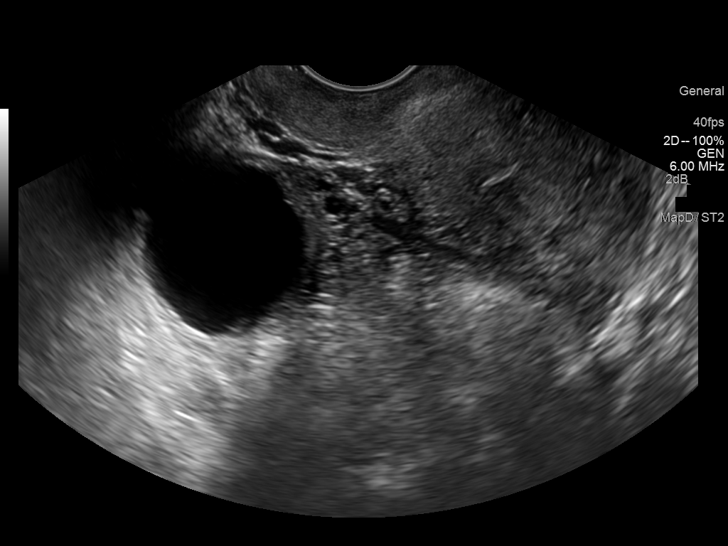
[im 59/65]
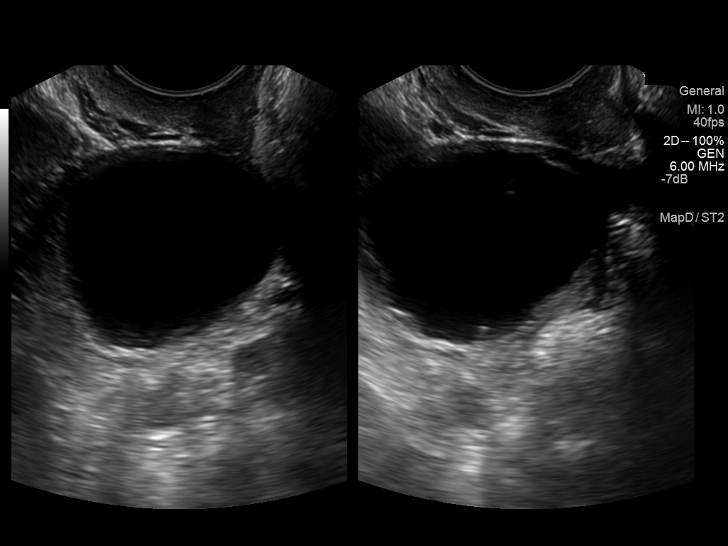
[im 65/65]
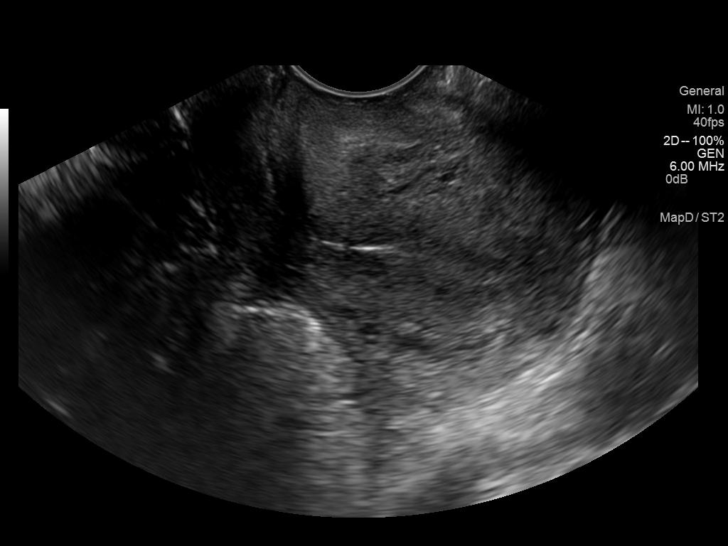

[14 of 25 positions shown; findings below may reference images not displayed]

FINDINGS: Uterus

Measurements: 6.0 x 3.5 x 4.7 cm. No fibroids or other mass
visualized. The uterus is retroverted in nature.

Endometrium

Thickness: 0.5 cm.  No focal abnormality visualized.

Right ovary

Measurements: 4.5 x 3.6 x 4.5 cm. A 4.3 x 3.8 x 3.3 cm simple cyst
is noted at the right ovary.

Left ovary

Measurements: 2.9 x 1.6 x 1.8 cm. Normal appearance/no adnexal mass.

Other findings

Trace free fluid is seen in the left adnexa.
IMPRESSION: 1. Unremarkable pelvic ultrasound. The uterus is retroverted in
nature.
2. Simple 4.3 cm right ovarian cyst noted. No evidence for ovarian
torsion.

## 2015-02-03 IMAGING — US US PELV - US TRANSVAGINAL
1 series · 14 of 25 positions shown · non-contrast
Comparison: CT 10/26/2013.  Ultrasound 10/26/2013.

CLINICAL DATA: Right lower quadrant pelvic pain. Recent ectopic
pregnancy with surgery 2 weeks ago.

EXAM:
TRANSABDOMINAL AND TRANSVAGINAL ULTRASOUND OF PELVIS
TECHNIQUE: Both transabdominal and transvaginal ultrasound examinations of the
pelvis were performed. Transabdominal technique was performed for
global imaging of the pelvis including uterus, ovaries, adnexal
regions, and pelvic cul-de-sac. It was necessary to proceed with
endovaginal exam following the transabdominal exam to visualize the
uterus and adnexa.

[Series 1: us pelv - us transvaginal · 0.24mm/px · 14 of 59 slices shown]
[im 1/59]
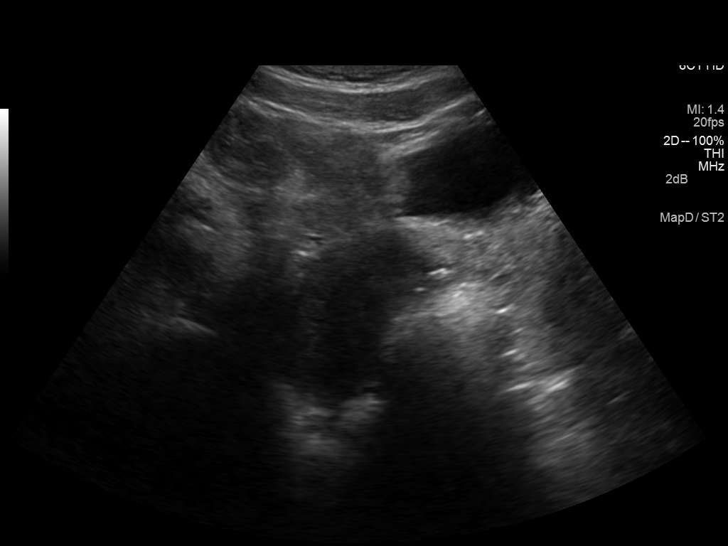
[im 5/59]
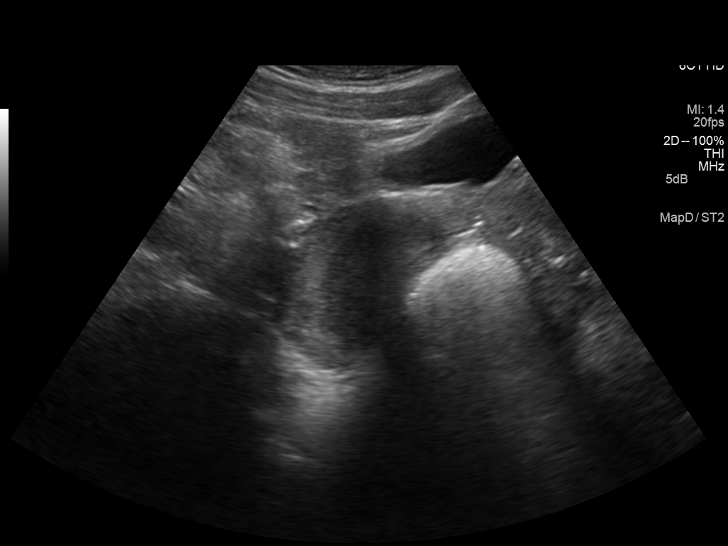
[im 10/59]
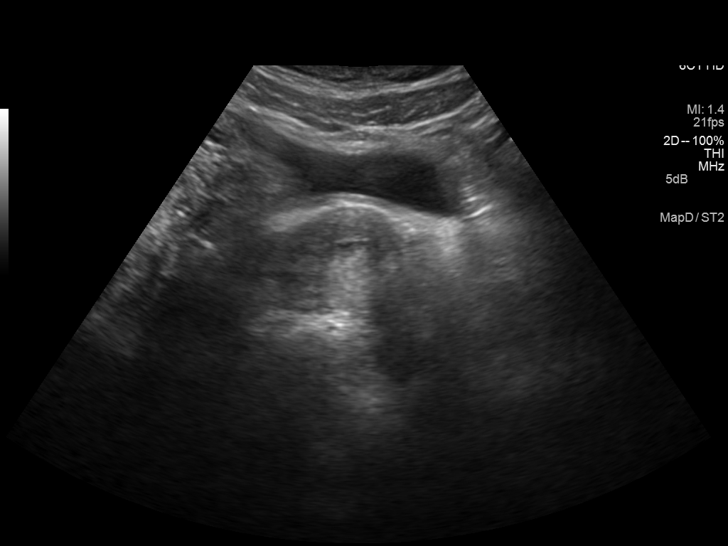
[im 15/59]
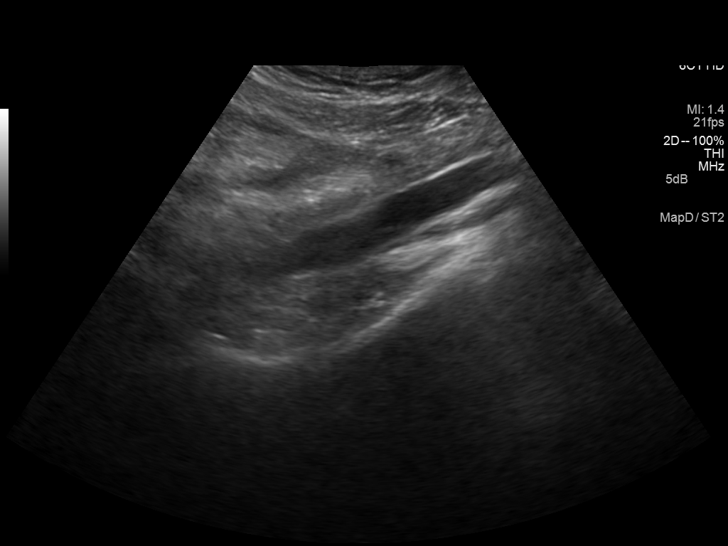
[im 20/59]
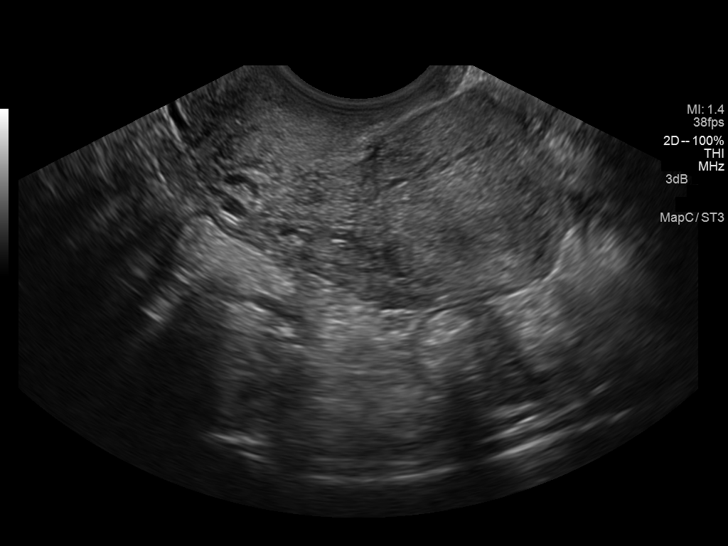
[im 22/59]
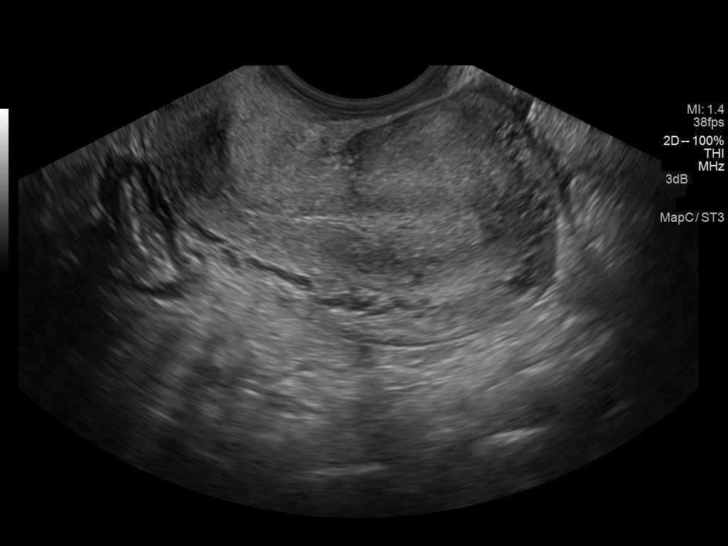
[im 27/59]
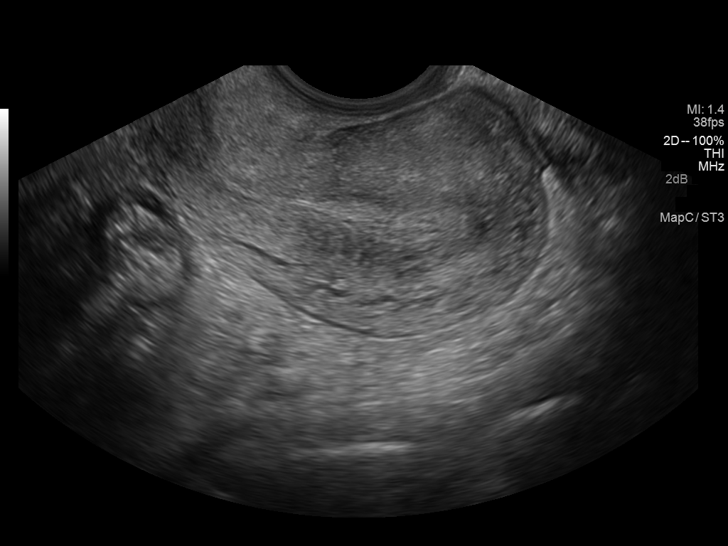
[im 32/59]
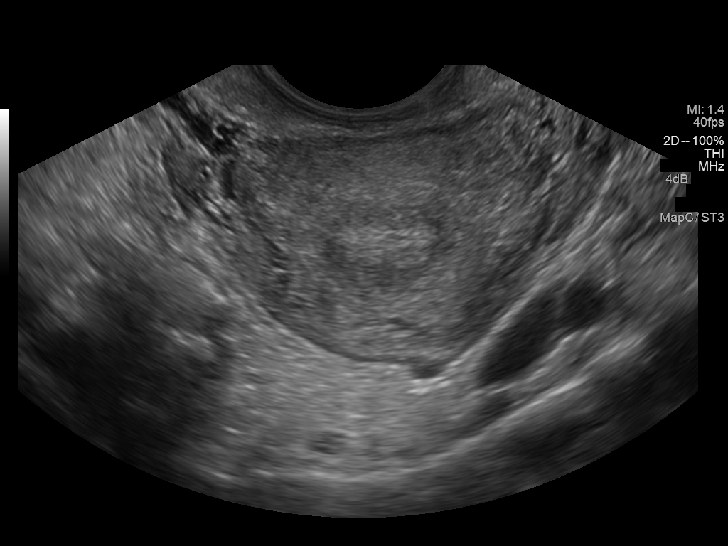
[im 37/59]
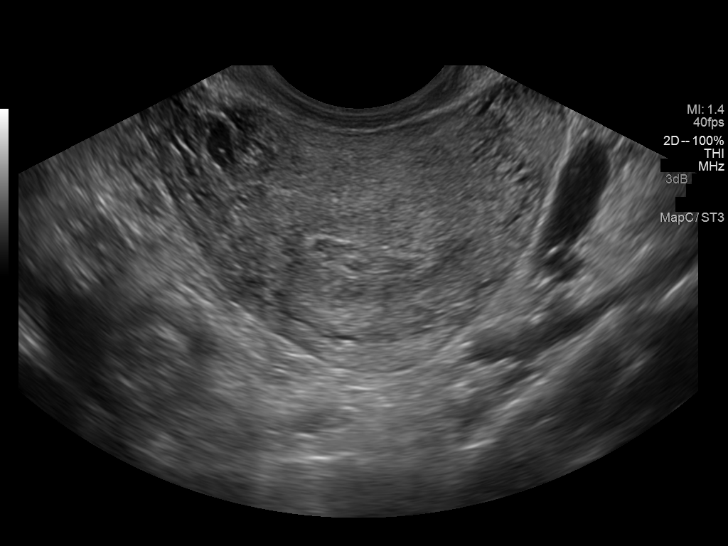
[im 39/59]
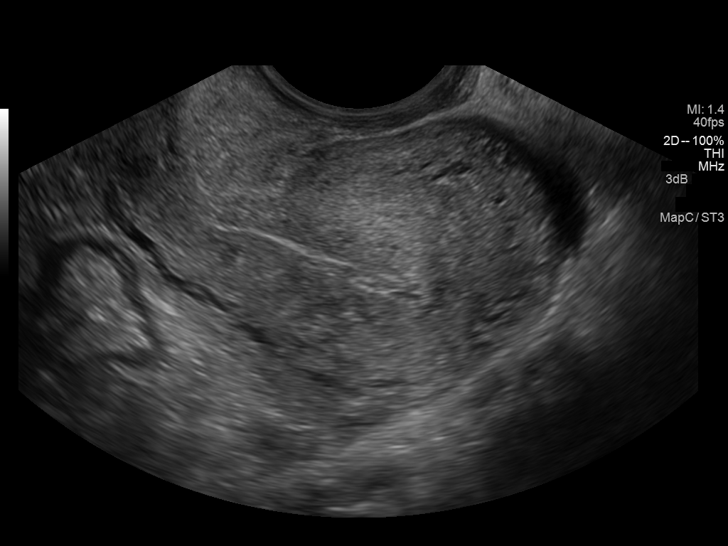
[im 44/59]
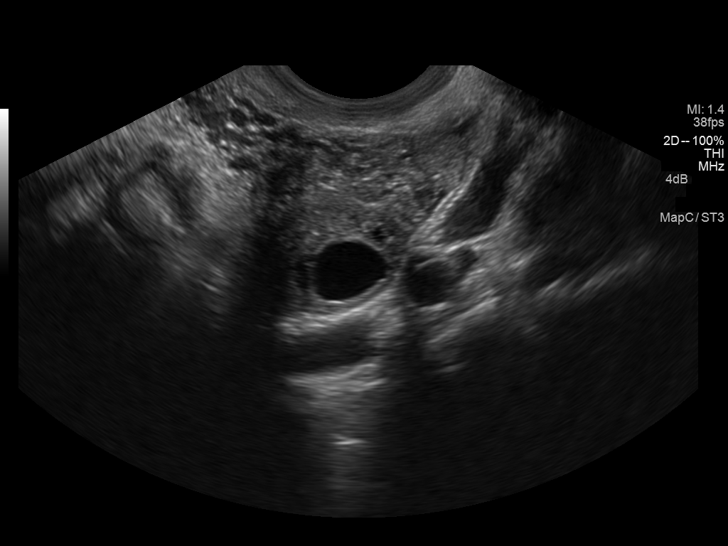
[im 49/59]
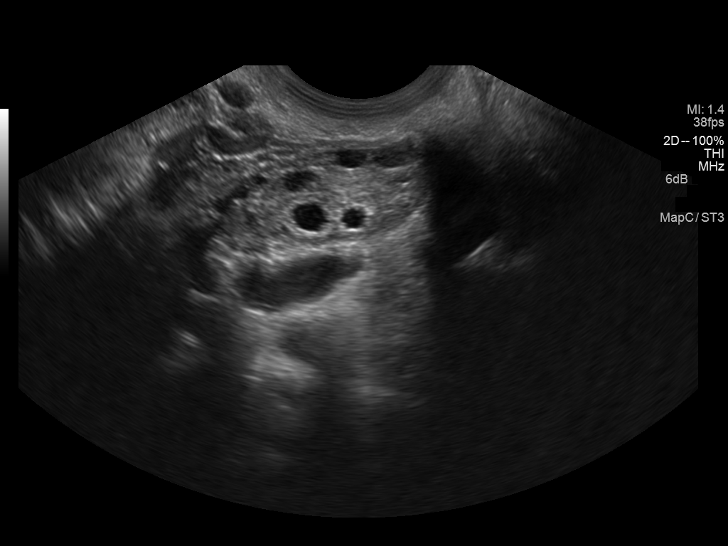
[im 54/59]
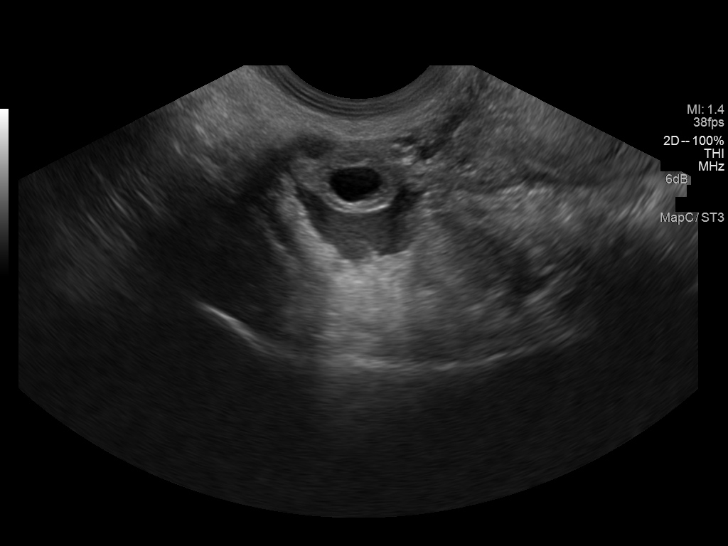
[im 59/59]
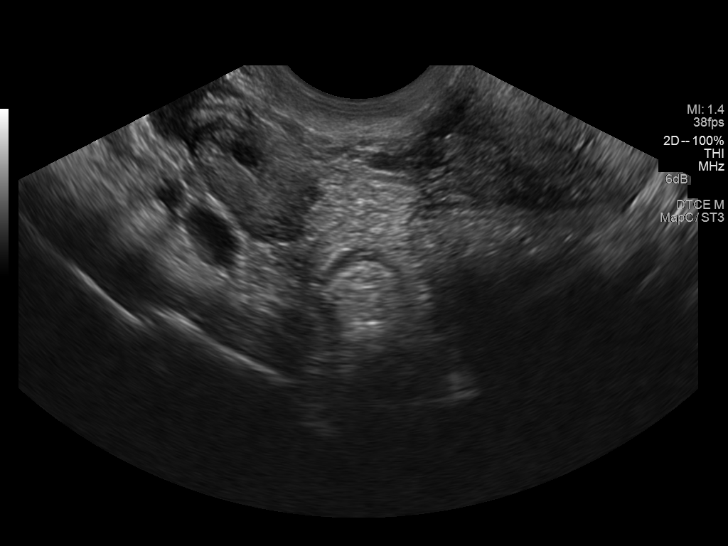

[14 of 25 positions shown; findings below may reference images not displayed]

FINDINGS: Uterus

Measurements: 7.9 x 3.3 x 4.5 cm. No fibroids or other mass
visualized.

Endometrium

Thickness: 5.3 mm.  No focal abnormality visualized.

Right ovary

Measurements: 3.2 x 1.4 x 1.7 cm. Normal appearance/no adnexal mass.

Left ovary

Measurements: 2.8 x 1.4 x 1.6 cm. Normal appearance/no adnexal mass.

Other findings

Small amount of free pelvic fluid adjacent to the right ovary.
IMPRESSION: Small amount of free pelvic fluid adjacent to the right ovary,
otherwise negative exam.

## 2015-06-07 IMAGING — US US PELV - US TRANSVAGINAL
1 series · 13 of 25 positions shown · non-contrast
Comparison: Pelvic ultrasound performed 01/28/2014

CLINICAL DATA: Acute onset of left pelvic pain for 2 days. Initial
encounter.

EXAM:
TRANSABDOMINAL AND TRANSVAGINAL ULTRASOUND OF PELVIS
TECHNIQUE: Both transabdominal and transvaginal ultrasound examinations of the
pelvis were performed. Transabdominal technique was performed for
global imaging of the pelvis including uterus, ovaries, adnexal
regions, and pelvic cul-de-sac. It was necessary to proceed with
endovaginal exam following the transabdominal exam to visualize the
endometrium and ovaries in greater detail.

[Series 1: us pelv - us transvaginal · 0.20mm/px · 13 of 68 slices shown]
[im 1/68]
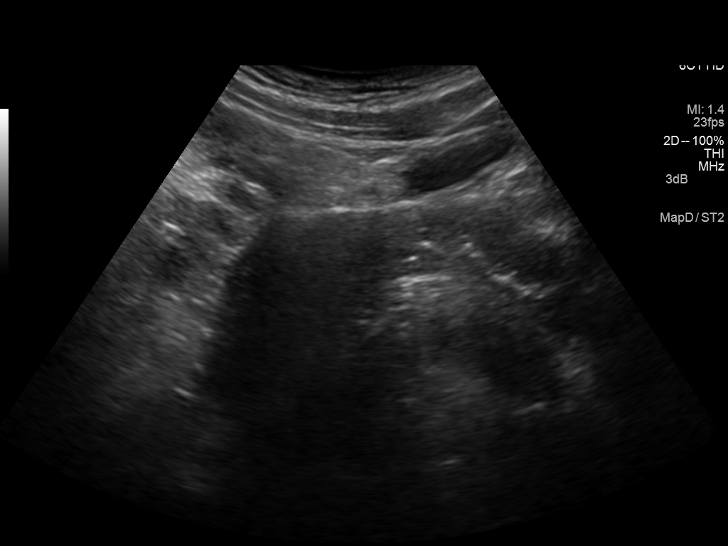
[im 6/68]
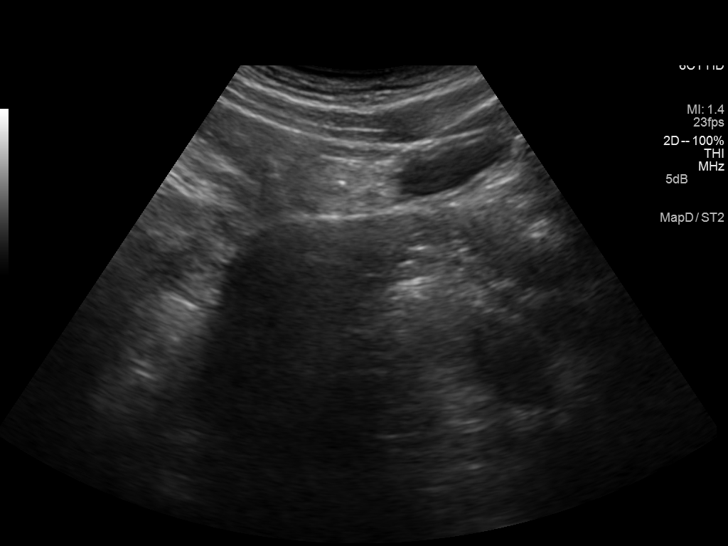
[im 12/68]
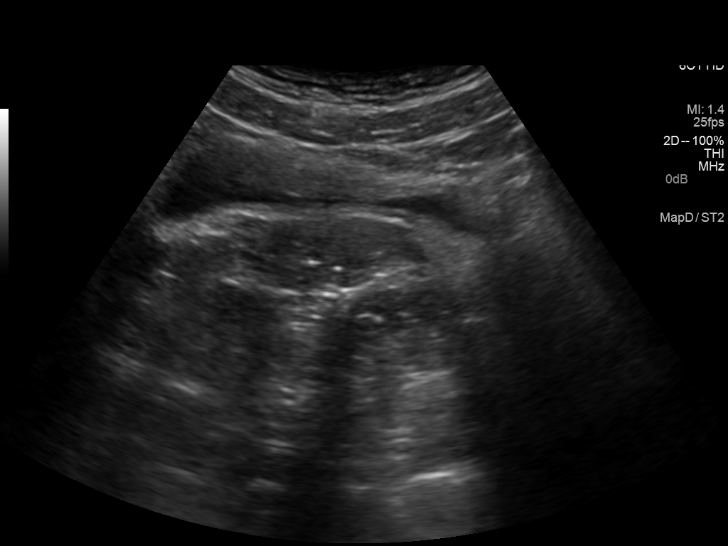
[im 17/68]
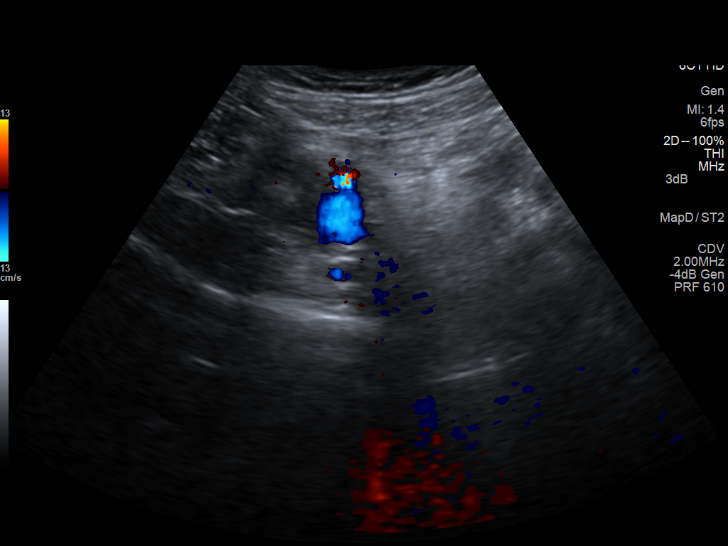
[im 23/68]
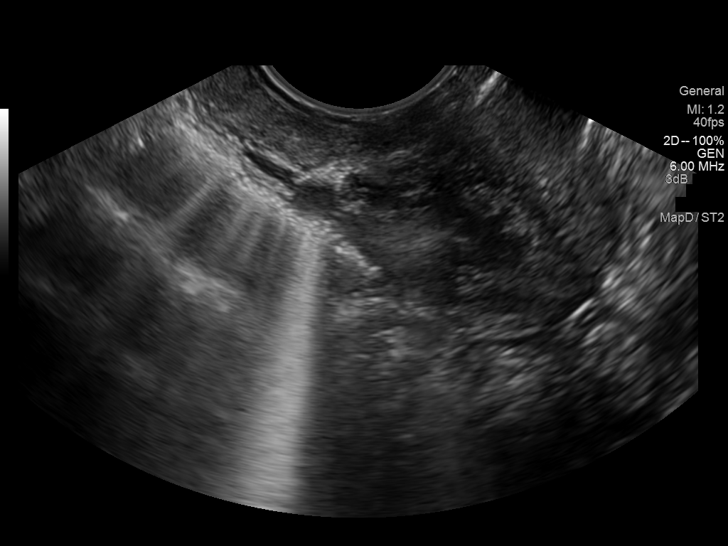
[im 28/68]
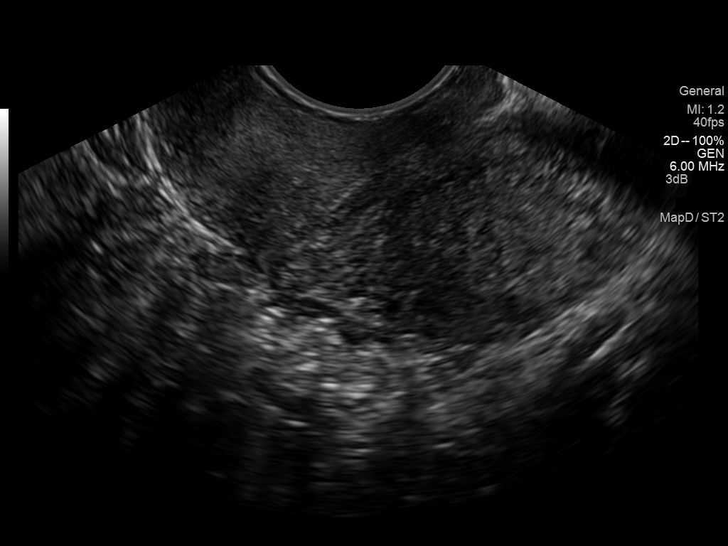
[im 34/68]
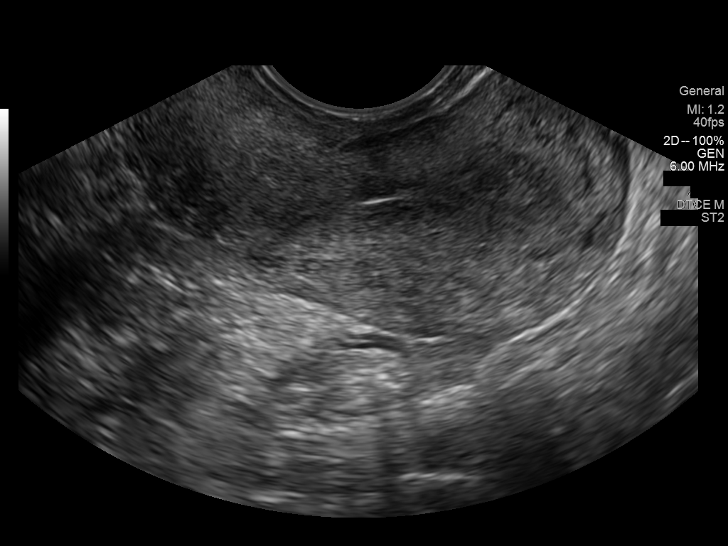
[im 40/68]
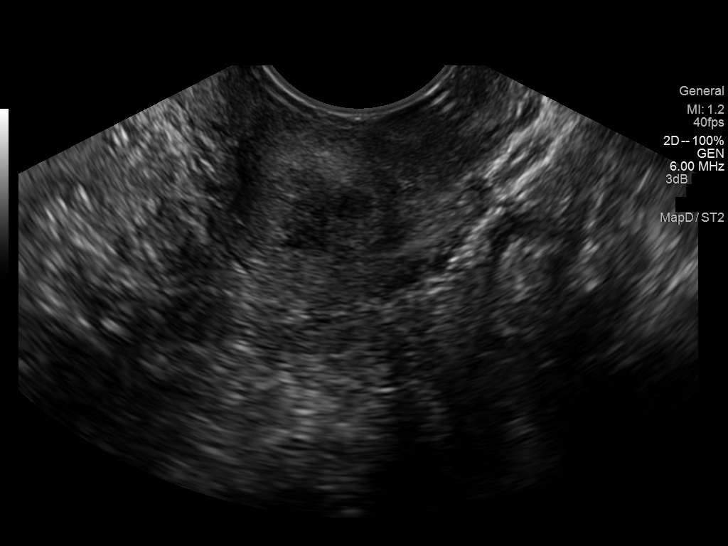
[im 45/68]
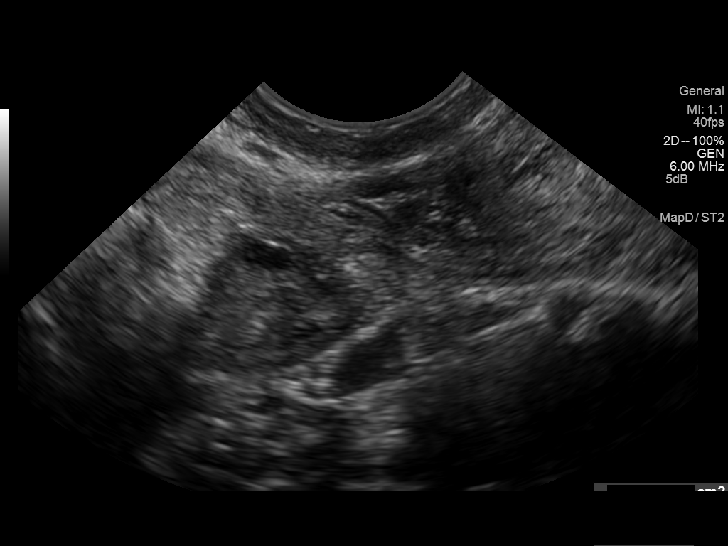
[im 51/68]
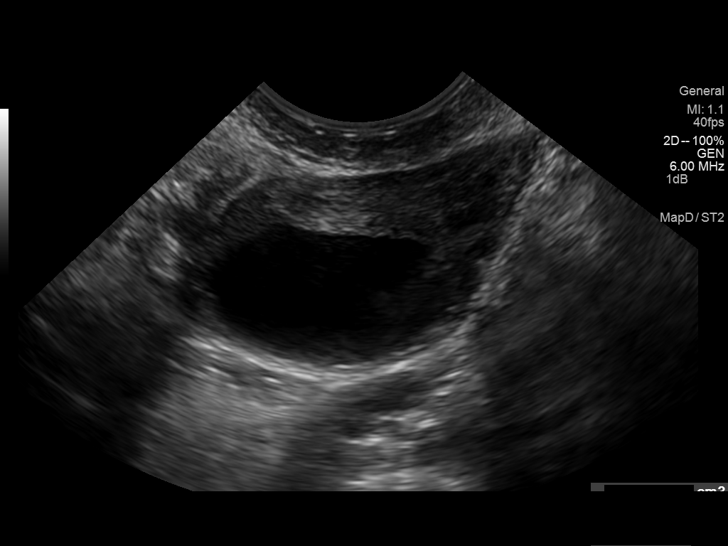
[im 56/68]
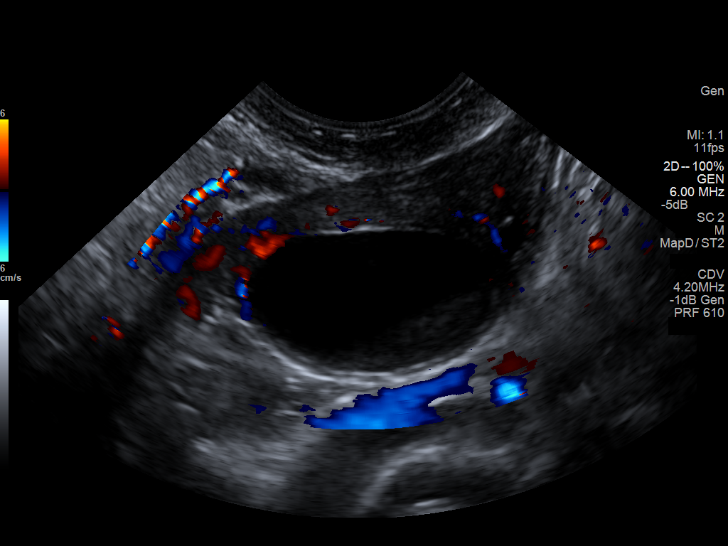
[im 62/68]
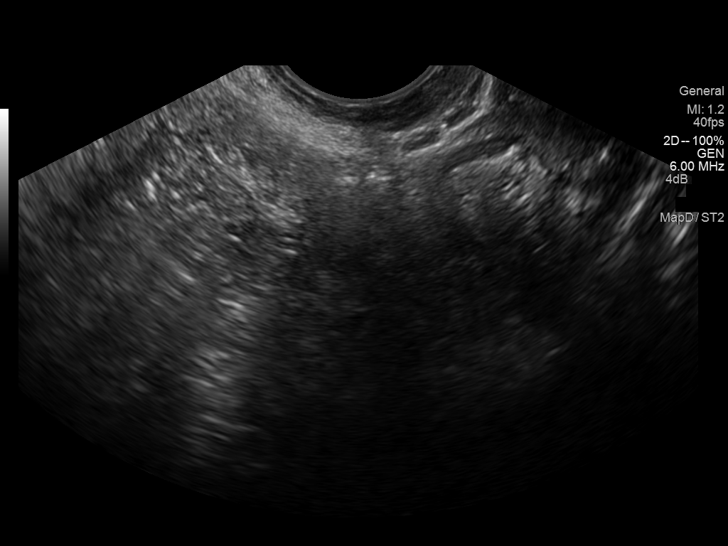
[im 68/68]
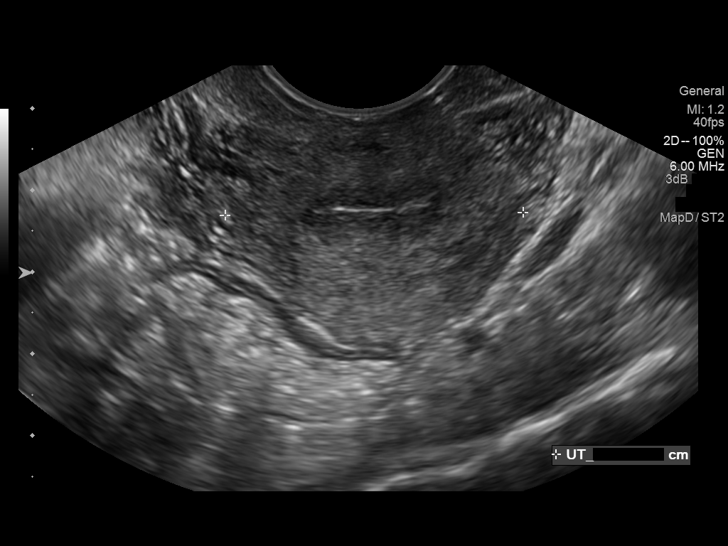

[13 of 25 positions shown; findings below may reference images not displayed]

FINDINGS: Uterus

Measurements: 6.2 x 3.6 x 3.6 cm. No fibroids or other mass
visualized.

Endometrium

Thickness: 0.7 cm.  No focal abnormality visualized.

Right ovary

Measurements: 3.8 x 2.4 x 2.0 cm. A slightly complex cystic focus is
noted at the right ovary, measuring 2.8 x 1.4 x 2.3 cm, with a few
internal echoes seen dependently. This may reflect minimal
hemorrhage or debris within a cyst.

Left ovary

Measurements: 2.6 x 2.1 x 1.5 cm. Normal appearance/no adnexal mass.

Other findings

Trace free fluid is seen within the pelvic cul-de-sac, likely
physiologic in nature.
IMPRESSION: 1. No acute abnormality seen within the pelvis.
2. Slightly complex 2.8 cm right ovarian cystic focus, with a few
internal echoes, may reflect minimal hemorrhage or debris within a
cyst. Ovaries otherwise unremarkable in appearance.

## 2015-11-24 ENCOUNTER — Emergency Department
Admission: EM | Admit: 2015-11-24 | Discharge: 2015-11-24 | Disposition: A | Discharge status: Home / Self Care | Payer: Medicaid Other | Attending: Emergency Medicine | Admitting: Emergency Medicine

## 2015-11-24 ENCOUNTER — Encounter: Payer: Self-pay | Admitting: Emergency Medicine

## 2015-11-24 DIAGNOSIS — M545 Low back pain: Secondary | ICD-10-CM | POA: Insufficient documentation

## 2015-11-24 DIAGNOSIS — R1013 Epigastric pain: Secondary | ICD-10-CM | POA: Diagnosis present

## 2015-11-24 DIAGNOSIS — R112 Nausea with vomiting, unspecified: Secondary | ICD-10-CM | POA: Insufficient documentation

## 2015-11-24 DIAGNOSIS — G8929 Other chronic pain: Secondary | ICD-10-CM | POA: Diagnosis not present

## 2015-11-24 MED ORDER — KETOROLAC TROMETHAMINE 30 MG/ML IJ SOLN
30.0000 mg | Freq: Once | INTRAMUSCULAR | Status: DC
  Filled 2015-11-24: qty 1

## 2015-11-24 MED ORDER — PROMETHAZINE HCL 25 MG/ML IJ SOLN
25.0000 mg | Freq: Once | INTRAMUSCULAR | Status: DC
  Filled 2015-11-24: qty 1

## 2015-11-24 MED ORDER — SODIUM CHLORIDE 0.9 % IV SOLN: 1000.0000 mL | Freq: Once | INTRAVENOUS | Status: DC

## 2015-11-24 NOTE — ED Notes (Signed)
Patient left without discharge paperwork or informing RN.

## 2015-11-24 NOTE — ED Notes (Addendum)
Md informed of multiple IV attempts.  Patient present with very little IV accessible veins due to scarring from "previous hospital visits and blood draws" stated by patient.  MD notified and is at bedside with patient to discuss options.

## 2015-11-24 NOTE — ED Notes (Signed)
Pt has been noted entering and exiting ED lobby several times with steady gait, no distress noted

## 2015-11-24 NOTE — ED Triage Notes (Signed)
Patient ambulatory to triage with steady gait, without difficulty or distress noted; pt reports sharp mid abd pain since yesterday accomp by N/V and hematuria

## 2015-11-24 NOTE — ED Provider Notes (Signed)
[facility] Emergency Department Provider Note        Time seen: ----------------------------------------- 7:04 AM on 11/24/2015 -----------------------------------------    I have reviewed the triage vital signs and the nursing notes.   HISTORY  Chief Complaint Abdominal Pain    HPI Brandy Jensen is a 35 y.o. female who presents to the ER with sharp upper abdominal pain since yesterday. Nausea and vomiting. She is also hematuria, she complains also pain in her right low back. She does report a history of kidney stones, nothing makes the pain better or worse. Patient states she is on Depakote currently, denies vaginal complaints. Pain is severe and sharp currently.   Past Medical History:  Diagnosis Date  . Kidney stone   . Kidney stones     There are no active problems to display for this patient.   Past Surgical History:  Procedure Laterality Date  . ECTOPIC PREGNANCY SURGERY      Allergies Metoclopramide hcl and Zofran  Social History Social History  Substance Use Topics  . Smoking status: Never Smoker  . Smokeless tobacco: Never Used  . Alcohol use No    Review of Systems Constitutional: Negative for fever. Cardiovascular: Negative for chest pain. Respiratory: Negative for shortness of breath. Gastrointestinal: Positive for abdominal pain, vomiting Genitourinary: Negative for dysuria. Musculoskeletal: Positive for right-sided low back pain Skin: Negative for rash. Neurological: Negative for headaches, focal weakness or numbness.  10-point ROS otherwise negative.  ____________________________________________   PHYSICAL EXAM:  VITAL SIGNS: ED Triage Vitals  Enc Vitals Group     BP 11/24/15 0542 (!) 151/98     Pulse Rate 11/24/15 0542 76     Resp 11/24/15 0542 18     Temp 11/24/15 0542 98.4 F (36.9 C)     Temp Source 11/24/15 0542 Oral     SpO2 11/24/15 0542 97 %     Weight 11/24/15 0542 135 lb (61.2 kg)     Height  11/24/15 0542 5' (1.524 m)     Head Circumference --      Peak Flow --      Pain Score 11/24/15 0543 8     Pain Loc --      Pain Edu? --      Excl. in GC? --     Constitutional: Alert and oriented. Mild distress Eyes: Conjunctivae are normal. PERRL. Normal extraocular movements. ENT   Head: Normocephalic and atraumatic.   Nose: No congestion/rhinnorhea.   Mouth/Throat: Mucous membranes are moist.   Neck: No stridor. Cardiovascular: Normal rate, regular rhythm. No murmurs, rubs, or gallops. Respiratory: Normal respiratory effort without tachypnea nor retractions. Breath sounds are clear and equal bilaterally. No wheezes/rales/rhonchi. Gastrointestinal: Soft and nontender. Normal bowel sounds Musculoskeletal: Nontender with normal range of motion in all extremities. No lower extremity tenderness nor edema. Neurologic:  Normal speech and language. No gross focal neurologic deficits are appreciated.  Skin:  Skin is warm, dry and intact. No rash noted. Psychiatric: Mood and affect are normal. Speech and behavior are normal.  ____________________________________________  ED COURSE:  Pertinent labs & imaging results that were available during my care of the patient were reviewed by me and considered in my medical decision making (see chart for details). Clinical Course  Patient presents to ER with abdominal pain of uncertain etiology. We will check basic labs, urinalysis and possible CT imaging.  Upon reviewing other ER visits, she has had numerous ER visits across the [place] for similar complaints.  Procedures ____________________________________________   LABS (pertinent positives/negatives)  Labs Reviewed  CBC WITH DIFFERENTIAL/PLATELET  COMPREHENSIVE METABOLIC PANEL  LIPASE, BLOOD  URINALYSIS COMPLETEWITH MICROSCOPIC ([facility] ONLY)  POC URINE PREG, ED   ____________________________________________  FINAL ASSESSMENT AND PLAN  Chronic pain  management  Plan: Patient with chronic abdominal pain and vomiting, to note she has had 74 ER visits in the last 6 months. Her only prescriptions filled or narcotics. I have offered her detox but she has declined. She is stable for discharge.   Emily FilbertWilliams, Jaya Lapka E, MD   Note: This dictation was prepared with Dragon dictation. Any transcriptional errors that result from this process are unintentional    Emily FilbertJonathan E Yannely Kintzel, MD 11/24/15 [phone]

## 2015-11-24 NOTE — ED Notes (Addendum)
Pt reports unable to give urine specimen at this time, as she voided in lobby prior to triage; attempted blood draw to right a/c; pt requests needle to be removed immediately due to discomfort and then requests right hand to be used; pt tolerates attempt poorly, tensing arm and pulling away; pt reports that she is "difficult stick" and requests to wait until seen by ED provider prior to additional attempt

## 2016-01-31 ENCOUNTER — Encounter (HOSPITAL_COMMUNITY): Payer: Self-pay

## 2016-01-31 ENCOUNTER — Emergency Department (HOSPITAL_COMMUNITY)
Admission: EM | Admit: 2016-01-31 | Discharge: 2016-01-31 | Discharge status: Against Medical Advice | Payer: Medicaid Other | Attending: Emergency Medicine | Admitting: Emergency Medicine

## 2016-01-31 DIAGNOSIS — R1084 Generalized abdominal pain: Secondary | ICD-10-CM | POA: Insufficient documentation

## 2016-01-31 DIAGNOSIS — R1031 Right lower quadrant pain: Secondary | ICD-10-CM | POA: Diagnosis present

## 2016-01-31 HISTORY — DX: Acute pancreatitis without necrosis or infection, unspecified: K85.90

## 2016-01-31 MED ORDER — IBUPROFEN 200 MG PO TABS: 400.0000 mg | ORAL_TABLET | Freq: Once | ORAL | Status: DC

## 2016-01-31 NOTE — ED Provider Notes (Signed)
[facility]-EMERGENCY DEPT Provider Note   CSN: [id number] Arrival date & time: 01/31/16  1837     History   Chief Complaint Chief Complaint  Patient presents with  . Abdominal Pain  . Back Pain  . Hematuria    HPI  Blood pressure 141/96, pulse 91, temperature 99.1 F (37.3 C), temperature source Oral, resp. rate 18, SpO2 99 %.  Brandy ClarksSherry Jensen is a 35 y.o. female complaining of hematuria and right lower abdominal pain radiating around to the right flank associated nausea onset 4 days ago pain is 8 out of 10. She [facility] powders without relief, has history of kidney stones and states this is similar. Patient endorses fever, chills, dysuria, urinary frequency.   Past Medical History:  Diagnosis Date  . Kidney stone   . Kidney stones   . Pancreatitis     There are no active problems to display for this patient.   Past Surgical History:  Procedure Laterality Date  . ECTOPIC PREGNANCY SURGERY      OB History    No data available       Home Medications    Prior to Admission medications   Medication Sig Start Date End Date Taking? Authorizing Provider  medroxyPROGESTERone (DEPO-PROVERA) 150 MG/ML injection Inject 150 mg into the muscle every 3 (three) months.   Yes Historical Provider, MD    Family History History reviewed. No pertinent family history.  Social History Social History  Substance Use Topics  . Smoking status: Never Smoker  . Smokeless tobacco: Never Used  . Alcohol use No     Allergies   Metoclopramide hcl and Zofran   Review of Systems Review of Systems  10 systems reviewed and found to be negative, except as noted in the HPI.  Physical Exam Updated Vital Signs BP 141/96 (BP Location: Right Arm)   Pulse 91   Temp 99.1 F (37.3 C) (Oral)   Resp 18   SpO2 99%   Physical Exam  Constitutional: She is oriented to person, place, and time. She appears well-developed and well-nourished. No distress.  HENT:  Head: Normocephalic and  atraumatic.  Mouth/Throat: Oropharynx is clear and moist.  Eyes: Conjunctivae and EOM are normal. Pupils are equal, round, and reactive to light.  Neck: Normal range of motion.  Cardiovascular: Normal rate, regular rhythm and intact distal pulses.   Pulmonary/Chest: Effort normal and breath sounds normal. No stridor.  Abdominal: Soft. Bowel sounds are normal. She exhibits no distension and no mass. There is tenderness. There is no rebound and no guarding. No hernia.  Genitourinary:  Genitourinary Comments: Tender in the right lower quadrant with no guarding or rebound, Rovsing, psoas an obturator are negative.  Musculoskeletal: Normal range of motion.  Neurological: She is alert and oriented to person, place, and time.  Skin: Capillary refill takes less than 2 seconds. She is not diaphoretic.  Psychiatric: She has a normal mood and affect.  Nursing note and vitals reviewed.    ED Treatments / Results  Labs (all labs ordered are listed, but only abnormal results are displayed) Labs Reviewed  URINALYSIS, ROUTINE W REFLEX MICROSCOPIC (NOT AT [facility])    EKG  EKG Interpretation None       Radiology No results found.  Procedures Procedures (including critical care time)  Medications Ordered in ED Medications - No data to display   Initial Impression / Assessment and Plan / ED Course  I have reviewed the triage vital signs and the nursing notes.  Pertinent labs &  imaging results that were available during my care of the patient were reviewed by me and considered in my medical decision making (see chart for details).  Clinical Course    Vitals:   01/31/16 1851  BP: 141/96  Pulse: 91  Resp: 18  Temp: 99.1 F (37.3 C)  TempSrc: Oral  SpO2: 99%    Medications  ibuprofen (ADVIL,MOTRIN) tablet 400 mg (not administered)    Brandy ClarksSherry Coppa is 35 y.o. female presenting with Right lower quadrant and right flank pain course of several days she says that she feels dry all home  which she's afebrile in the ED. Abdominal exam is nonsurgical. Chart review shows this patient with multiple ED visits all over the state. Multiple CAT scans the last one was several days ago showing no intrarenal stones. Will obtain basic blood work and urinalysis, ibuprofen for pain control.  Patient eloped from the ED before testing could be completed.   Final Clinical Impressions(s) / ED Diagnoses   Final diagnoses:  None    New Prescriptions New Prescriptions   No medications on file     Wynetta Emeryicole Charnele Semple, PA-C 01/31/16 2131    Rolland PorterMark James, MD 02/09/16 [phone]

## 2016-01-31 NOTE — ED Triage Notes (Signed)
Pt c/o RLQ abdominal pain radiating into low back, hematuria, and "a little" nausea x "a few days."  Pain score 8/10.  Pt reports taking BC powder w/o relief.  Hx of kidney stones.  Per chart review, Pt has been seen by several different hospital systems recently for same.  Pt was seen yesterday for same at a [facility] facility and left AMA after being told that she was not going to receive narcotics.

## 2016-01-31 NOTE — ED Notes (Signed)
Pt stated she was going to the restroom. After this writer returned to assess pt, pt was not in room. After 20 minutes pt never returned and gown was laid across the bed.
